# Patient Record
Sex: Female | Born: 1960 | Race: White | Hispanic: No | Marital: Married | Smoking: Never smoker
Health system: Southern US, Community
[De-identification: ages and names within clinical notes are randomized; demographics above are authoritative.]

## PROBLEM LIST (undated history)

## (undated) DIAGNOSIS — I219 Acute myocardial infarction, unspecified: Secondary | ICD-10-CM

## (undated) DIAGNOSIS — I2510 Atherosclerotic heart disease of native coronary artery without angina pectoris: Secondary | ICD-10-CM

## (undated) DIAGNOSIS — E785 Hyperlipidemia, unspecified: Secondary | ICD-10-CM

## (undated) HISTORY — DX: Hyperlipidemia, unspecified: E78.5

## (undated) HISTORY — PX: TONSILLECTOMY AND ADENOIDECTOMY: SUR1326

## (undated) HISTORY — PX: CARDIAC CATHETERIZATION: SHX172

---

## 2004-03-20 ENCOUNTER — Other Ambulatory Visit: Admission: RE | Admit: 2004-03-20 | Discharge: 2004-03-20 | Payer: Self-pay | Admitting: Obstetrics and Gynecology

## 2005-07-09 ENCOUNTER — Ambulatory Visit (HOSPITAL_COMMUNITY): Admission: RE | Admit: 2005-07-09 | Discharge: 2005-07-09 | Payer: Self-pay | Admitting: Obstetrics and Gynecology

## 2005-07-16 ENCOUNTER — Encounter: Admission: RE | Admit: 2005-07-16 | Discharge: 2005-07-16 | Payer: Self-pay | Admitting: Obstetrics and Gynecology

## 2005-09-03 ENCOUNTER — Other Ambulatory Visit: Admission: RE | Admit: 2005-09-03 | Discharge: 2005-09-03 | Payer: Self-pay | Admitting: Obstetrics and Gynecology

## 2006-02-04 ENCOUNTER — Encounter: Admission: RE | Admit: 2006-02-04 | Discharge: 2006-02-04 | Payer: Self-pay | Admitting: Obstetrics and Gynecology

## 2007-01-06 ENCOUNTER — Other Ambulatory Visit: Admission: RE | Admit: 2007-01-06 | Discharge: 2007-01-06 | Payer: Self-pay | Admitting: Obstetrics and Gynecology

## 2007-02-24 ENCOUNTER — Encounter: Admission: RE | Admit: 2007-02-24 | Discharge: 2007-02-24 | Payer: Self-pay | Admitting: Obstetrics and Gynecology

## 2008-02-23 ENCOUNTER — Other Ambulatory Visit: Admission: RE | Admit: 2008-02-23 | Discharge: 2008-02-23 | Payer: Self-pay | Admitting: Obstetrics & Gynecology

## 2008-03-15 ENCOUNTER — Encounter: Admission: RE | Admit: 2008-03-15 | Discharge: 2008-03-15 | Payer: Self-pay | Admitting: Obstetrics and Gynecology

## 2009-03-21 ENCOUNTER — Encounter: Admission: RE | Admit: 2009-03-21 | Discharge: 2009-03-21 | Payer: Self-pay | Admitting: Obstetrics and Gynecology

## 2009-03-28 ENCOUNTER — Other Ambulatory Visit: Admission: RE | Admit: 2009-03-28 | Discharge: 2009-03-28 | Payer: Self-pay | Admitting: Obstetrics and Gynecology

## 2009-11-22 HISTORY — PX: RETINAL DETACHMENT SURGERY: SHX105

## 2010-04-10 ENCOUNTER — Encounter: Admission: RE | Admit: 2010-04-10 | Discharge: 2010-04-10 | Payer: Self-pay | Admitting: Obstetrics and Gynecology

## 2011-03-29 ENCOUNTER — Other Ambulatory Visit: Payer: Self-pay | Admitting: Obstetrics and Gynecology

## 2011-03-29 DIAGNOSIS — Z1231 Encounter for screening mammogram for malignant neoplasm of breast: Secondary | ICD-10-CM

## 2011-04-20 ENCOUNTER — Ambulatory Visit
Admission: RE | Admit: 2011-04-20 | Discharge: 2011-04-20 | Disposition: A | Discharge status: Home / Self Care | Payer: PRIVATE HEALTH INSURANCE | Source: Ambulatory Visit | Attending: Obstetrics and Gynecology | Admitting: Obstetrics and Gynecology

## 2011-04-20 DIAGNOSIS — Z1231 Encounter for screening mammogram for malignant neoplasm of breast: Secondary | ICD-10-CM

## 2012-03-21 ENCOUNTER — Other Ambulatory Visit: Payer: Self-pay | Admitting: Obstetrics and Gynecology

## 2012-03-21 DIAGNOSIS — Z1231 Encounter for screening mammogram for malignant neoplasm of breast: Secondary | ICD-10-CM

## 2012-04-24 ENCOUNTER — Ambulatory Visit
Admission: RE | Admit: 2012-04-24 | Discharge: 2012-04-24 | Disposition: A | Discharge status: Home / Self Care | Payer: PRIVATE HEALTH INSURANCE | Source: Ambulatory Visit | Attending: Obstetrics and Gynecology | Admitting: Obstetrics and Gynecology

## 2012-04-24 DIAGNOSIS — Z1231 Encounter for screening mammogram for malignant neoplasm of breast: Secondary | ICD-10-CM

## 2013-03-20 ENCOUNTER — Other Ambulatory Visit: Payer: Self-pay

## 2013-03-20 DIAGNOSIS — Z1231 Encounter for screening mammogram for malignant neoplasm of breast: Secondary | ICD-10-CM

## 2013-04-25 ENCOUNTER — Ambulatory Visit
Admission: RE | Admit: 2013-04-25 | Discharge: 2013-04-25 | Disposition: A | Discharge status: Home / Self Care | Payer: PRIVATE HEALTH INSURANCE | Source: Ambulatory Visit

## 2013-04-25 DIAGNOSIS — Z1231 Encounter for screening mammogram for malignant neoplasm of breast: Secondary | ICD-10-CM

## 2013-04-29 ENCOUNTER — Other Ambulatory Visit: Payer: Self-pay | Admitting: Certified Nurse Midwife

## 2013-04-30 ENCOUNTER — Telehealth: Payer: Self-pay | Admitting: *Deleted

## 2013-04-30 MED ORDER — DROSPIRENONE-ETHINYL ESTRADIOL 3-0.02 MG PO TABS: 1.0000 | ORAL_TABLET | Freq: Every day | ORAL | Status: DC

## 2013-04-30 NOTE — Telephone Encounter (Signed)
agree

## 2013-04-30 NOTE — Telephone Encounter (Signed)
last aex- 04/28/2012. next aex- 05/15/2013. 1 pack of Yaz/0 refills sent to pharmacy to maintain coverage until next aex.

## 2013-05-09 ENCOUNTER — Encounter: Payer: Self-pay | Admitting: Certified Nurse Midwife

## 2013-05-15 ENCOUNTER — Ambulatory Visit (INDEPENDENT_AMBULATORY_CARE_PROVIDER_SITE_OTHER): Payer: PRIVATE HEALTH INSURANCE | Admitting: Certified Nurse Midwife

## 2013-05-15 ENCOUNTER — Encounter: Payer: Self-pay | Admitting: Certified Nurse Midwife

## 2013-05-15 VITALS — BP 100/60 | HR 80 | Resp 16 | Ht 64.25 in | Wt 134.0 lb

## 2013-05-15 DIAGNOSIS — Z309 Encounter for contraceptive management, unspecified: Secondary | ICD-10-CM

## 2013-05-15 DIAGNOSIS — IMO0001 Reserved for inherently not codable concepts without codable children: Secondary | ICD-10-CM

## 2013-05-15 DIAGNOSIS — Z01419 Encounter for gynecological examination (general) (routine) without abnormal findings: Secondary | ICD-10-CM

## 2013-05-15 DIAGNOSIS — Z0000 Encounter for general adult medical examination without abnormal findings: Secondary | ICD-10-CM

## 2013-05-15 LAB — POCT URINALYSIS DIPSTICK
Bilirubin, UA: NEGATIVE
Blood, UA: NEGATIVE
Glucose, UA: NEGATIVE
Nitrite, UA: NEGATIVE
Protein, UA: NEGATIVE
pH, UA: 5

## 2013-05-15 LAB — HEMOGLOBIN A1C
Hgb A1c MFr Bld: 5.2 % (ref <5.7)
Mean Plasma Glucose: 103 mg/dL (ref <117)

## 2013-05-15 MED ORDER — DROSPIRENONE-ETHINYL ESTRADIOL 3-0.02 MG PO TABS: 1.0000 | ORAL_TABLET | Freq: Every day | ORAL | Status: DC

## 2013-05-15 NOTE — Progress Notes (Signed)
52 y.o. G2P1001 Married Caucasian Fe here for annual exam.   Periods normal, sometimes amount changes, but no problems.  Contraception working well, uses OCP. Saw PCP regarding elevated Triclycerides , changed diet and returned to normal. Released to follow up here. No health issues today. Fasted for labs. Patient's last menstrual period was 05/09/2013.          Sexually active: yes  The current method of family planning is OCP (estrogen/progesterone).    Exercising: yes  walking Smoker:  no  Health Maintenance: Pap:  04-28-12 neg HPV HR neg MMG: 04-25-13 nprmal Colonoscopy:  none BMD:   none TDaP:  02-23-08 Labs: Poct urine neg-, Hgb-13.3 Self breast exam: montly   reports that she has never smoked. She does not have any smokeless tobacco history on file. She reports that she drinks about 1.5 ounces of alcohol per week. She reports that she does not use illicit drugs.  No past medical history on file.  Past Surgical History  Procedure Laterality Date  . Retinal detachment surgery  2011    Current Outpatient Prescriptions  Medication Sig Dispense Refill  . calcium carbonate (TUMS FRESHERS) 500 MG chewable tablet Chew 1 tablet by mouth as needed for heartburn.      . drospirenone-ethinyl estradiol (YAZ) 3-0.02 MG tablet Take 1 tablet by mouth daily.  1 Package  0  . Multiple Vitamins-Minerals (MULTIVITAMIN PO) Take by mouth daily.      Marland Kitchen YAZ 3-0.02 MG tablet TAKE ONE TABLET BY MOUTH ONE TIME DAILY  28 tablet  0   No current facility-administered medications for this visit.    Family History  Problem Relation Age of Onset  . Hypertension Father   . Cancer Maternal Uncle     prostate  . Cancer Maternal Grandfather     prostate  . Heart disease Paternal Grandmother   . Heart disease Paternal Grandfather     ROS:  Pertinent items are noted in HPI.  Otherwise, a comprehensive ROS was negative.  Exam:   Ht 5' 4.25" (1.632 m)  Wt 134 lb (60.782 kg)  BMI 22.82 kg/m2  LMP  05/09/2013 Height: 5' 4.25" (163.2 cm)  Ht Readings from Last 3 Encounters:  05/15/13 5' 4.25" (1.632 m)    General appearance: alert, cooperative and appears stated age Head: Normocephalic, without obvious abnormality, atraumatic Neck: no adenopathy, supple, symmetrical, trachea midline and thyroid normal to inspection and palpation Lungs: clear to auscultation bilaterally Breasts: normal appearance, no masses or tenderness, No nipple retraction or dimpling, No nipple discharge or bleeding, No axillary or supraclavicular adenopathy Heart: regular rate and rhythm Abdomen: soft, non-tender; no masses,  no organomegaly Extremities: extremities normal, atraumatic, no cyanosis or edema Skin: Skin color, texture, turgor normal. No rashes or lesions Lymph nodes: Cervical, supraclavicular, and axillary nodes normal. No abnormal inguinal nodes palpated Neurologic: Grossly normal   Pelvic: External genitalia:  no lesions              Urethra:  normal appearing urethra with no masses, tenderness or lesions              Bartholin's and Skene's: normal                 Vagina: normal appearing vagina with normal color and discharge, no lesions              Cervix: normal and  non tender              Pap taken:  no Bimanual Exam:  Uterus:  normal size, contour, position, consistency, mobility, non-tender and retroverted              Adnexa: normal adnexa and no mass, fullness, tenderness               Rectovaginal: Confirms               Anus:  normal sphincter tone, no lesions  A:  Well Woman with normal exam  Contraception OCP  P:  Reviewed health and wellness pertinent to exam   Rx Yaz see order  Labs; Lipid panel, TSH, Hgb A-1c  Pap smear as per guidelines  Mammogram yearly pap smear not taken today  counseled on breast self exam, mammography screening, adequate intake of calcium and vitamin D, diet and exercise  return annually or prn  An After Visit Summary was printed and given to  the patient.  Reviewed, TL

## 2013-05-15 NOTE — Patient Instructions (Signed)

## 2013-05-16 LAB — LIPID PANEL
HDL: 63 mg/dL (ref >39)
LDL Cholesterol: 53 mg/dL (ref 0–99)
Total CHOL/HDL Ratio: 2.5 Ratio
Triglycerides: 204 mg/dL — ABNORMAL HIGH (ref <150)
VLDL: 41 mg/dL — ABNORMAL HIGH (ref 0–40)

## 2013-05-17 ENCOUNTER — Telehealth: Payer: Self-pay

## 2013-05-17 NOTE — Telephone Encounter (Signed)
Patient notified of results.

## 2013-05-17 NOTE — Telephone Encounter (Signed)
Message copied by Eliezer Bottom on Thu May 17, 2013 10:42 AM ------      Message from: Verner Chol      Created: Wed May 16, 2013 10:04 PM       Notify Triglycerides still elevated 204 and VLDL( bad cholesterol ) just slightly elevated.      Needs to follow up with PCP as discussed      Hgb A1-c is normal and TSH normal ------

## 2013-05-17 NOTE — Telephone Encounter (Signed)
lmtcb

## 2013-09-27 ENCOUNTER — Other Ambulatory Visit: Payer: Self-pay

## 2014-03-25 ENCOUNTER — Other Ambulatory Visit: Payer: Self-pay

## 2014-03-25 DIAGNOSIS — Z1231 Encounter for screening mammogram for malignant neoplasm of breast: Secondary | ICD-10-CM

## 2014-04-26 ENCOUNTER — Ambulatory Visit
Admission: RE | Admit: 2014-04-26 | Discharge: 2014-04-26 | Disposition: A | Discharge status: Home / Self Care | Payer: PRIVATE HEALTH INSURANCE | Source: Ambulatory Visit

## 2014-04-26 DIAGNOSIS — Z1231 Encounter for screening mammogram for malignant neoplasm of breast: Secondary | ICD-10-CM

## 2014-05-06 ENCOUNTER — Other Ambulatory Visit: Payer: Self-pay | Admitting: Certified Nurse Midwife

## 2014-05-06 NOTE — Telephone Encounter (Signed)
AEX scheduled for 05/16/14 #28/0 refills sent in to pharmacy to last patient until AEX

## 2014-05-16 ENCOUNTER — Encounter: Payer: Self-pay | Admitting: Certified Nurse Midwife

## 2014-05-16 ENCOUNTER — Ambulatory Visit (INDEPENDENT_AMBULATORY_CARE_PROVIDER_SITE_OTHER): Payer: PRIVATE HEALTH INSURANCE | Admitting: Certified Nurse Midwife

## 2014-05-16 VITALS — BP 118/64 | HR 68 | Resp 16 | Ht 63.75 in | Wt 136.0 lb

## 2014-05-16 DIAGNOSIS — Z309 Encounter for contraceptive management, unspecified: Secondary | ICD-10-CM

## 2014-05-16 DIAGNOSIS — Z01419 Encounter for gynecological examination (general) (routine) without abnormal findings: Secondary | ICD-10-CM

## 2014-05-16 DIAGNOSIS — Z0000 Encounter for general adult medical examination without abnormal findings: Secondary | ICD-10-CM

## 2014-05-16 DIAGNOSIS — Z124 Encounter for screening for malignant neoplasm of cervix: Secondary | ICD-10-CM

## 2014-05-16 LAB — POCT URINALYSIS DIPSTICK
Bilirubin, UA: NEGATIVE
GLUCOSE UA: NEGATIVE
KETONES UA: NEGATIVE
Leukocytes, UA: NEGATIVE
Nitrite, UA: NEGATIVE
PH UA: 5
Protein, UA: NEGATIVE
RBC UA: NEGATIVE
UROBILINOGEN UA: NEGATIVE

## 2014-05-16 MED ORDER — DROSPIRENONE-ETHINYL ESTRADIOL 3-0.02 MG PO TABS: ORAL_TABLET | ORAL | Status: DC

## 2014-05-16 NOTE — Progress Notes (Signed)
53 y.o. 691P1001 Married Caucasian Fe here for annual exam.  Periods normal, but lighter. No cramping or issues. Up to date with mammogram. Recent visit with PCP 05/01/14, all labs normal. No problems. Contraception working well. Desires OCP again. No other health issues. Going to [place]!  Patient's last menstrual period was 05/08/2014.          Sexually active: Yes.    The current method of family planning is OCP (estrogen/progesterone).    Exercising: Yes.    walking & light hand weights Smoker:  no  Health Maintenance: Pap: 04-28-12 neg HPV HR neg MMG:  04-26-14 normal Density category D Bi rads category 1 negative Colonoscopy:  none BMD:   none TDaP:  2009 Labs: Poct urine-neg Self breast exam: done occ   reports that she has never smoked. She does not have any smokeless tobacco history on file. She reports that she drinks about 2 ounces of alcohol per week. She reports that she does not use illicit drugs.  Past Medical History  Diagnosis Date  . Hyperlipidemia     Past Surgical History  Procedure Laterality Date  . Retinal detachment surgery  2011  . Tonsillectomy and adenoidectomy      Current Outpatient Prescriptions  Medication Sig Dispense Refill  . calcium carbonate (TUMS FRESHERS) 500 MG chewable tablet Chew 1 tablet by mouth as needed for heartburn.      Marland Kitchen. GIANVI 3-0.02 MG tablet TAKE ONE TABLET BY MOUTH ONE TIME DAILY   28 tablet  2  . Multiple Vitamins-Minerals (MULTIVITAMIN PO) Take by mouth daily.      . Omega-3 Fatty Acids (FISH OIL) 1000 MG CAPS Take 2,000 mg by mouth daily.       Marland Kitchen. LORazepam (ATIVAN) 0.5 MG tablet as needed.       No current facility-administered medications for this visit.    Family History  Problem Relation Age of Onset  . Hypertension Father   . Cancer Maternal Uncle     prostate  . Cancer Maternal Grandfather     prostate  . Heart disease Paternal Grandmother   . Heart disease Paternal Grandfather     ROS:   Pertinent items are noted in HPI.  Otherwise, a comprehensive ROS was negative.  Exam:   BP 118/64  Pulse 68  Resp 16  Ht 5' 3.75" (1.619 m)  Wt 136 lb (61.689 kg)  BMI 23.53 kg/m2  LMP 05/08/2014 Height: 5' 3.75" (161.9 cm)  Ht Readings from Last 3 Encounters:  05/16/14 5' 3.75" (1.619 m)  05/15/13 5' 4.25" (1.632 m)    General appearance: alert, cooperative and appears stated age Head: Normocephalic, without obvious abnormality, atraumatic Neck: no adenopathy, supple, symmetrical, trachea midline and thyroid normal to inspection and palpation and non-palpable Lungs: clear to auscultation bilaterally Breasts: normal appearance, no masses or tenderness, No nipple retraction or dimpling, No nipple discharge or bleeding, No axillary or supraclavicular adenopathy Heart: regular rate and rhythm Abdomen: soft, non-tender; no masses,  no organomegaly Extremities: extremities normal, atraumatic, no cyanosis or edema Skin: Skin color, texture, turgor normal. No rashes or lesions Lymph nodes: Cervical, supraclavicular, and axillary nodes normal. No abnormal inguinal nodes palpated Neurologic: Grossly normal   Pelvic: External genitalia:  no lesions              Urethra:  normal appearing urethra with no masses, tenderness or lesions              Bartholin's and Skene's: normal  Vagina: normal appearing vagina with normal color and discharge, no lesions              Cervix: normal, non tender              Pap taken: Yes.   Bimanual Exam:  Uterus:  normal size, contour, position, consistency, mobility, non-tender and anteverted              Adnexa: normal adnexa and no mass, fullness, tenderness               Rectovaginal: Confirms               Anus:  normal sphincter tone, no lesions  A:  Well Woman with normal exam  Contraception OCP desired  Colonoscopy due  P:   Reviewed health and wellness pertinent to exam  Rx Yaz see order, discussed lowering dosage at next  aex. If periods stop advise and if symptomatic of menopause  Pap smear taken today with HPV reflex  Colonoscopy risks and benefits discussed. Patient plans to schedule this year. Will call for referral when ready.   counseled on breast self exam, mammography screening, use and side effects of OCP's, adequate intake of calcium and vitamin D, diet and exercise  return annually or prn  An After Visit Summary was printed and given to the patient.

## 2014-05-16 NOTE — Patient Instructions (Signed)

## 2014-05-20 LAB — IPS PAP TEST WITH REFLEX TO HPV

## 2014-05-20 NOTE — Progress Notes (Signed)
Reviewed personally.  M. Suzanne Miller, MD.  

## 2014-09-23 ENCOUNTER — Encounter: Payer: Self-pay | Admitting: Certified Nurse Midwife

## 2015-03-21 ENCOUNTER — Other Ambulatory Visit: Payer: Self-pay

## 2015-03-21 DIAGNOSIS — Z1231 Encounter for screening mammogram for malignant neoplasm of breast: Secondary | ICD-10-CM

## 2015-04-30 ENCOUNTER — Ambulatory Visit
Admission: RE | Admit: 2015-04-30 | Discharge: 2015-04-30 | Disposition: A | Discharge status: Home / Self Care | Payer: No Typology Code available for payment source | Source: Ambulatory Visit

## 2015-04-30 DIAGNOSIS — Z1231 Encounter for screening mammogram for malignant neoplasm of breast: Secondary | ICD-10-CM

## 2015-05-27 ENCOUNTER — Other Ambulatory Visit: Payer: Self-pay | Admitting: Certified Nurse Midwife

## 2015-05-27 NOTE — Telephone Encounter (Signed)
Medication refill request: Loryna  Last AEX: 05/16/14 with DL  Next AEX: 1/61/097/22/16 with DL  Last MMG (if hormonal medication request): 04/30/15 bi-rads 1: negative Refill authorized: #28/0 rfs

## 2015-06-13 ENCOUNTER — Ambulatory Visit (INDEPENDENT_AMBULATORY_CARE_PROVIDER_SITE_OTHER): Payer: PRIVATE HEALTH INSURANCE | Admitting: Certified Nurse Midwife

## 2015-06-13 ENCOUNTER — Encounter: Payer: Self-pay | Admitting: Certified Nurse Midwife

## 2015-06-13 VITALS — BP 110/70 | HR 74 | Resp 16 | Ht 64.0 in | Wt 136.0 lb

## 2015-06-13 DIAGNOSIS — Z3041 Encounter for surveillance of contraceptive pills: Secondary | ICD-10-CM

## 2015-06-13 DIAGNOSIS — Z0000 Encounter for general adult medical examination without abnormal findings: Secondary | ICD-10-CM | POA: Diagnosis not present

## 2015-06-13 DIAGNOSIS — N951 Menopausal and female climacteric states: Secondary | ICD-10-CM | POA: Diagnosis not present

## 2015-06-13 DIAGNOSIS — Z01419 Encounter for gynecological examination (general) (routine) without abnormal findings: Secondary | ICD-10-CM | POA: Diagnosis not present

## 2015-06-13 DIAGNOSIS — Z1211 Encounter for screening for malignant neoplasm of colon: Secondary | ICD-10-CM

## 2015-06-13 LAB — POCT URINALYSIS DIPSTICK
Bilirubin, UA: NEGATIVE
Blood, UA: NEGATIVE
Glucose, UA: NEGATIVE
KETONES UA: NEGATIVE
LEUKOCYTES UA: NEGATIVE
NITRITE UA: NEGATIVE
PH UA: 5
Protein, UA: NEGATIVE
Urobilinogen, UA: NEGATIVE

## 2015-06-13 LAB — CBC
HEMATOCRIT: 43.4 % (ref 36.0–46.0)
HEMOGLOBIN: 14.4 g/dL (ref 12.0–15.0)
MCH: 29.5 pg (ref 26.0–34.0)
MCHC: 33.2 g/dL (ref 30.0–36.0)
MCV: 88.9 fL (ref 78.0–100.0)
MPV: 8.9 fL (ref 8.6–12.4)
Platelets: 315 10*3/uL (ref 150–400)
RBC: 4.88 MIL/uL (ref 3.87–5.11)
RDW: 12.7 % (ref 11.5–15.5)
WBC: 9.4 10*3/uL (ref 4.0–10.5)

## 2015-06-13 MED ORDER — DROSPIRENONE-ETHINYL ESTRADIOL 3-0.02 MG PO TABS
1.0000 | ORAL_TABLET | Freq: Every day | ORAL | Status: DC
Start: 2015-06-13 — End: 2016-06-16

## 2015-06-13 NOTE — Progress Notes (Signed)
54 y.o. G47P1001 Married  Caucasian Fe here for annual exam. Periods very light and or just spotting. Contraception OCP,working well. Sees PCP prn.  LMP:  06/06/2015         Sexually active: Yes.    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     Exercising: Yes.    walking Smoker:  no  Health Maintenance: Pap:  04/28/14 MMG:  04/26/14 normal density Category D Bi-rads 1 neg. Colonoscopy:  none BMD:   none TDaP:  2009 Labs: drawn, poct urine-neg, Hgb-14.0   reports that she has never smoked. She does not have any smokeless tobacco history on file. She reports that she drinks about 2.0 oz of alcohol per week. She reports that she does not use illicit drugs.  Past Medical History  Diagnosis Date  . Hyperlipidemia     Past Surgical History  Procedure Laterality Date  . Retinal detachment surgery  2011  . Tonsillectomy and adenoidectomy      Current Outpatient Prescriptions  Medication Sig Dispense Refill  . calcium carbonate (TUMS FRESHERS) 500 MG chewable tablet Chew 1 tablet by mouth as needed for heartburn.    Marland Kitchen LORazepam (ATIVAN) 0.5 MG tablet as needed.    Marland Kitchen LORYNA 3-0.02 MG tablet TAKE ONE TABLET BY MOUTH ONE TIME DAILY 28 tablet 0  . Multiple Vitamins-Minerals (MULTIVITAMIN PO) Take by mouth daily.    . Omega-3 Fatty Acids (FISH OIL) 1000 MG CAPS Take 2,000 mg by mouth daily.      No current facility-administered medications for this visit.    Family History  Problem Relation Age of Onset  . Hypertension Father   . Cancer Maternal Uncle     prostate  . Cancer Maternal Grandfather     prostate  . Heart disease Paternal Grandmother   . Heart disease Paternal Grandfather     ROS:  Pertinent items are noted in HPI.  Otherwise, a comprehensive ROS was negative.  Exam:   Ht  (1.626 m)  Wt 136 lb (61.689 kg)  BMI 23.33 kg/m2  LMP 06/06/2015 (Exact Date) Height:  (162.6 cm) Ht Readings from Last 3 Encounters:  06/13/15  (1.626 m)   05/16/14 5' 3.75" (1.619 m)  05/15/13 5' 4.25" (1.632 m)    General appearance: alert, cooperative and appears stated age Head: Normocephalic, without obvious abnormality, atraumatic Neck: no adenopathy, supple, symmetrical, trachea midline and thyroid normal to inspection and palpation Lungs: clear to auscultation bilaterally Breasts: normal appearance, no masses or tenderness, No nipple retraction or dimpling, No nipple discharge or bleeding, No axillary or supraclavicular adenopathy Heart: regular rate and rhythm Abdomen: soft, non-tender; no masses,  no organomegaly Extremities: extremities normal, atraumatic, no cyanosis or edema Skin: Skin color, texture, turgor normal. No rashes or lesions Lymph nodes: Cervical, supraclavicular, and axillary nodes normal. No abnormal inguinal nodes palpated Neurologic: Grossly normal   Pelvic: External genitalia:  no lesions              Urethra:  normal appearing urethra with no masses, tenderness or lesions              Bartholin's and Skene's: normal                 Vagina: normal appearing vagina with normal color and discharge, no lesions              Cervix: normal,non tender, no lesions  Pap taken: No. Bimanual Exam:  Uterus:  normal size, contour, position, consistency, mobility, non-tender and anteverted              Adnexa: normal adnexa and no mass, fullness, tenderness               Rectovaginal: Confirms               Anus:  normal sphincter tone, no lesions  Chaperone present: Yes    A:  Well Woman with normal exam  Contraception OCP desires continuance  Perimenopausal with cycle change  Colonoscopy due   Screening labs  P:   Reviewed health and wellness pertinent to exam  Discussed needs to come off of OCP by 52, patient aware and feel like she is becoming menopausal with cycle changes of lighter and minimal bleeding. Plan to come in 12/12 for [facility], for assessment and will be off OCP for at least a week  prior to drawing. Will not restart at that point. Will refill until then.  Rx Loryna see order  Request referral for colonoscopy will refer to Dr Loreta Ave and will be called with appointment  Labs: Lipid panel, CBC, TSH, Vitamin D, CMP  Pap smear not taken   counseled on breast self exam, mammography screening, adequate intake of calcium and vitamin D, diet and exercise, colonoscopy  return annually or prn  An After Visit Summary was printed and given to the patient.

## 2015-06-13 NOTE — Patient Instructions (Signed)

## 2015-06-13 NOTE — Progress Notes (Signed)
Reviewed personally.  M. Suzanne Anakaren Campion, MD.  

## 2015-06-14 LAB — COMPREHENSIVE METABOLIC PANEL
ALBUMIN: 4.4 g/dL (ref 3.5–5.2)
ALK PHOS: 92 U/L (ref 39–117)
ALT: 21 U/L (ref 0–35)
AST: 23 U/L (ref 0–37)
BUN: 11 mg/dL (ref 6–23)
CALCIUM: 9.8 mg/dL (ref 8.4–10.5)
CO2: 22 mEq/L (ref 19–32)
CREATININE: 0.77 mg/dL (ref 0.50–1.10)
Chloride: 98 mEq/L (ref 96–112)
GLUCOSE: 87 mg/dL (ref 70–99)
POTASSIUM: 4.7 meq/L (ref 3.5–5.3)
SODIUM: 135 meq/L (ref 135–145)
Total Bilirubin: 0.6 mg/dL (ref 0.2–1.2)
Total Protein: 7.1 g/dL (ref 6.0–8.3)

## 2015-06-14 LAB — LIPID PANEL
CHOL/HDL RATIO: 3.4 ratio
Cholesterol: 205 mg/dL — ABNORMAL HIGH (ref 0–200)
HDL: 61 mg/dL (ref >=46)
LDL CALC: 71 mg/dL (ref 0–99)
TRIGLYCERIDES: 364 mg/dL — AB (ref <150)
VLDL: 73 mg/dL — AB (ref 0–40)

## 2015-06-14 LAB — VITAMIN D 25 HYDROXY (VIT D DEFICIENCY, FRACTURES): VIT D 25 HYDROXY: 39 ng/mL (ref 30–100)

## 2015-06-14 LAB — TSH: TSH: 1.772 u[IU]/mL (ref 0.350–4.500)

## 2015-07-23 ENCOUNTER — Telehealth: Payer: Self-pay | Admitting: Certified Nurse Midwife

## 2015-07-23 NOTE — Telephone Encounter (Signed)
Patient returning call. Gave patient referring information.

## 2015-07-23 NOTE — Telephone Encounter (Signed)
Left voicemail regarding referral appointment. The information is listed below. Should the patient need to cancel or reschedule this appointment, please advise them to call the office they've been referred to in order to reschedule.  [facility] [address], [place], [place] [address] [phone]  August 05, 2015 10:45 -- arrive at [employer] card and photo id

## 2015-11-20 ENCOUNTER — Telehealth: Payer: Self-pay

## 2015-11-20 ENCOUNTER — Other Ambulatory Visit (INDEPENDENT_AMBULATORY_CARE_PROVIDER_SITE_OTHER): Payer: Managed Care, Other (non HMO)

## 2015-11-20 DIAGNOSIS — N951 Menopausal and female climacteric states: Secondary | ICD-10-CM

## 2015-11-20 LAB — FOLLICLE STIMULATING HORMONE: FSH: 0.4 m[IU]/mL

## 2015-11-20 NOTE — Telephone Encounter (Signed)
Left message to call back  

## 2015-11-20 NOTE — Telephone Encounter (Signed)
Pt came in for lab visit today & asked phlebotomist when her labwork will be back. Pt stated to her that she guess she will start her pills back. Per office visit it looks like you said you did not want patient to start back on pills at all. Please advise

## 2015-11-20 NOTE — Telephone Encounter (Signed)
We were going to determine if she needed them and then decide after labs done.

## 2015-11-20 NOTE — Telephone Encounter (Signed)
Patient notified as written by provider °

## 2015-11-24 LAB — ANTI MULLERIAN HORMONE: AMH AssessR: <0.03 ng/mL

## 2016-04-16 ENCOUNTER — Other Ambulatory Visit: Payer: Self-pay

## 2016-04-16 DIAGNOSIS — Z1231 Encounter for screening mammogram for malignant neoplasm of breast: Secondary | ICD-10-CM

## 2016-05-24 ENCOUNTER — Ambulatory Visit: Payer: No Typology Code available for payment source

## 2016-05-27 ENCOUNTER — Ambulatory Visit
Admission: RE | Admit: 2016-05-27 | Discharge: 2016-05-27 | Disposition: A | Discharge status: Home / Self Care | Payer: Managed Care, Other (non HMO) | Source: Ambulatory Visit

## 2016-05-27 DIAGNOSIS — Z1231 Encounter for screening mammogram for malignant neoplasm of breast: Secondary | ICD-10-CM

## 2016-06-16 ENCOUNTER — Ambulatory Visit (INDEPENDENT_AMBULATORY_CARE_PROVIDER_SITE_OTHER): Payer: Managed Care, Other (non HMO) | Admitting: Certified Nurse Midwife

## 2016-06-16 ENCOUNTER — Encounter: Payer: Self-pay | Admitting: Certified Nurse Midwife

## 2016-06-16 VITALS — BP 118/72 | HR 72 | Resp 16 | Ht 63.75 in | Wt 145.0 lb

## 2016-06-16 DIAGNOSIS — N912 Amenorrhea, unspecified: Secondary | ICD-10-CM | POA: Diagnosis not present

## 2016-06-16 DIAGNOSIS — Z124 Encounter for screening for malignant neoplasm of cervix: Secondary | ICD-10-CM | POA: Diagnosis not present

## 2016-06-16 DIAGNOSIS — Z0000 Encounter for general adult medical examination without abnormal findings: Secondary | ICD-10-CM | POA: Diagnosis not present

## 2016-06-16 DIAGNOSIS — N951 Menopausal and female climacteric states: Secondary | ICD-10-CM

## 2016-06-16 DIAGNOSIS — Z01419 Encounter for gynecological examination (general) (routine) without abnormal findings: Secondary | ICD-10-CM | POA: Diagnosis not present

## 2016-06-16 LAB — POCT URINALYSIS DIPSTICK
Bilirubin, UA: NEGATIVE
GLUCOSE UA: NEGATIVE
Ketones, UA: NEGATIVE
Leukocytes, UA: NEGATIVE
NITRITE UA: NEGATIVE
PROTEIN UA: NEGATIVE
RBC UA: NEGATIVE
UROBILINOGEN UA: NEGATIVE
pH, UA: 5

## 2016-06-16 LAB — CBC
HCT: 45 % (ref 35.0–45.0)
Hemoglobin: 14.8 g/dL (ref 11.7–15.5)
MCH: 29.5 pg (ref 27.0–33.0)
MCHC: 32.9 g/dL (ref 32.0–36.0)
MCV: 89.6 fL (ref 80.0–100.0)
MPV: 9.5 fL (ref 7.5–12.5)
PLATELETS: 293 10*3/uL (ref 140–400)
RBC: 5.02 MIL/uL (ref 3.80–5.10)
RDW: 13 % (ref 11.0–15.0)
WBC: 8.4 10*3/uL (ref 3.8–10.8)

## 2016-06-16 LAB — POCT URINE PREGNANCY: Preg Test, Ur: NEGATIVE

## 2016-06-16 NOTE — Patient Instructions (Signed)
EXERCISE AND DIET:  We recommended that you start or continue a regular exercise program for good health. Regular exercise means any activity that makes your heart beat faster and makes you sweat.  We recommend exercising at least 30 minutes per day at least 3 days a week, preferably 4 or 5.  We also recommend a diet low in fat and sugar.  Inactivity, poor dietary choices and obesity can cause diabetes, heart attack, stroke, and kidney damage, among others.    ALCOHOL AND SMOKING:  Women should limit their alcohol intake to no more than 7 drinks/beers/glasses of wine (combined, not each!) per week. Moderation of alcohol intake to this level decreases your risk of breast cancer and liver damage. And of course, no recreational drugs are part of a healthy lifestyle.  And absolutely no smoking or even second hand smoke. Most people know smoking can cause heart and lung diseases, but did you know it also contributes to weakening of your bones? Aging of your skin?  Yellowing of your teeth and nails?  CALCIUM AND VITAMIN D:  Adequate intake of calcium and Vitamin D are recommended.  The recommendations for exact amounts of these supplements seem to change often, but generally speaking 600 mg of calcium (either carbonate or citrate) and 800 units of Vitamin D per day seems prudent. Certain women may benefit from higher intake of Vitamin D.  If you are among these women, your doctor will have told you during your visit.    PAP SMEARS:  Pap smears, to check for cervical cancer or precancers,  have traditionally been done yearly, although recent scientific advances have shown that most women can have pap smears less often.  However, every woman still should have a physical exam from her gynecologist every year. It will include a breast check, inspection of the vulva and vagina to check for abnormal growths or skin changes, a visual exam of the cervix, and then an exam to evaluate the size and shape of the uterus and  ovaries.  And after 53 years of age, a rectal exam is indicated to check for rectal cancers. We will also provide age appropriate advice regarding health maintenance, like when you should have certain vaccines, screening for sexually transmitted diseases, bone density testing, colonoscopy, mammograms, etc.   MAMMOGRAMS:  All women over 40 years old should have a yearly mammogram. Many facilities now offer a "3D" mammogram, which may cost around $50 extra out of pocket. If possible,  we recommend you accept the option to have the 3D mammogram performed.  It both reduces the number of women who will be called back for extra views which then turn out to be normal, and it is better than the routine mammogram at detecting truly abnormal areas.    COLONOSCOPY:  Colonoscopy to screen for colon cancer is recommended for all women at age 50.  We know, you hate the idea of the prep.  We agree, BUT, having colon cancer and not knowing it is worse!!  Colon cancer so often starts as a polyp that can be seen and removed at colonscopy, which can quite literally save your life!  And if your first colonoscopy is normal and you have no family history of colon cancer, most women don't have to have it again for 10 years.  Once every ten years, you can do something that may end up saving your life, right?  We will be happy to help you get it scheduled when you are ready.    Be sure to check your insurance coverage so you understand how much it will cost.  It may be covered as a preventative service at no cost, but you should check your particular policy.     Perimenopause Perimenopause is the time when your body begins to move into the menopause (no menstrual period for 12 straight months). It is a natural process. Perimenopause can begin 2-8 years before the menopause and usually lasts for 1 year after the menopause. During this time, your ovaries may or may not produce an egg. The ovaries vary in their production of estrogen and  progesterone hormones each month. This can cause irregular menstrual periods, difficulty getting pregnant, vaginal bleeding between periods, and uncomfortable symptoms. CAUSES  Irregular production of the ovarian hormones, estrogen and progesterone, and not ovulating every month.  Other causes include:  Tumor of the pituitary gland in the brain.  Medical disease that affects the ovaries.  Radiation treatment.  Chemotherapy.  Unknown causes.  Heavy smoking and excessive alcohol intake can bring on perimenopause sooner. SIGNS AND SYMPTOMS   Hot flashes.  Night sweats.  Irregular menstrual periods.  Decreased sex drive.  Vaginal dryness.  Headaches.  Mood swings.  Depression.  Memory problems.  Irritability.  Tiredness.  Weight gain.  Trouble getting pregnant.  The beginning of losing bone cells (osteoporosis).  The beginning of hardening of the arteries (atherosclerosis). DIAGNOSIS  Your health care provider will make a diagnosis by analyzing your age, menstrual history, and symptoms. He or she will do a physical exam and note any changes in your body, especially your female organs. Female hormone tests may or may not be helpful depending on the amount of female hormones you produce and when you produce them. However, other hormone tests may be helpful to rule out other problems. TREATMENT  In some cases, no treatment is needed. The decision on whether treatment is necessary during the perimenopause should be made by you and your health care provider based on how the symptoms are affecting you and your lifestyle. Various treatments are available, such as:  Treating individual symptoms with a specific medicine for that symptom.  Herbal medicines that can help specific symptoms.  Counseling.  Group therapy. HOME CARE INSTRUCTIONS   Keep track of your menstrual periods (when they occur, how heavy they are, how long between periods, and how long they last) as  well as your symptoms and when they started.  Only take over-the-counter or prescription medicines as directed by your health care provider.  Sleep and rest.  Exercise.  Eat a diet that contains calcium (good for your bones) and soy (acts like the estrogen hormone).  Do not smoke.  Avoid alcoholic beverages.  Take vitamin supplements as recommended by your health care provider. Taking vitamin E may help in certain cases.  Take calcium and vitamin D supplements to help prevent bone loss.  Group therapy is sometimes helpful.  Acupuncture may help in some cases. SEEK MEDICAL CARE IF:   You have questions about any symptoms you are having.  You need a referral to a specialist (gynecologist, psychiatrist, or psychologist). SEEK IMMEDIATE MEDICAL CARE IF:   You have vaginal bleeding.  Your period lasts longer than 8 days.  Your periods are recurring sooner than 21 days.  You have bleeding after intercourse.  You have severe depression.  You have pain when you urinate.  You have severe headaches.  You have vision problems.   This information is not intended to replace advice   given to you by your health care provider. Make sure you discuss any questions you have with your health care provider.   Document Released: 12/16/2004 Document Revised: 11/29/2014 Document Reviewed: 06/07/2013 Elsevier Interactive Patient Education 2016 [employer]  

## 2016-06-16 NOTE — Progress Notes (Signed)
55 y.o. G66P1001 Married  Caucasian Fe here for annual exam. Stopped OCP after last visit. LMP 11/18/15. Denies vaginal bleeding since then. Denies vaginal dryness, has always uses lubrication. Some hot flashes and night sweats, but have improved. Sees Dr.Sun PCP. Desires screening labs today. 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 No other health issues today.  Patient's last menstrual period was 11/18/2015 (exact date).        Sexually active: Yes.    The current method of family planning is none.    Exercising: Yes.    walking Smoker:  no  Health Maintenance: Pap:  04-28-14 neg MMG:  05-27-16 category c density birads 1:neg Colonoscopy:  2016 f/u 52yrs BMD:   none TDaP:  2009 Shingles: no Pneumonia: no Hep C and HIV: Hep C. Labs: poct urine-neg, poct upt-neg Self breast exam: done monthly   reports that she has never smoked. She does not have any smokeless tobacco history on file. She reports that she drinks about 3.0 - 4.2 oz of alcohol per week . She reports that she does not use drugs.  Past Medical History:  Diagnosis Date  . Hyperlipidemia     Past Surgical History:  Procedure Laterality Date  . RETINAL DETACHMENT SURGERY  2011  . TONSILLECTOMY AND ADENOIDECTOMY      Current Outpatient Prescriptions  Medication Sig Dispense Refill  . calcium carbonate (TUMS FRESHERS) 500 MG chewable tablet Chew 1 tablet by mouth as needed for heartburn.    . Multiple Vitamins-Minerals (MULTIVITAMIN PO) Take by mouth daily.    . Omega-3 Fatty Acids (FISH OIL) 1000 MG CAPS Take 2,000 mg by mouth daily.     Marland Kitchen LORazepam (ATIVAN) 0.5 MG tablet as needed.     No current facility-administered medications for this visit.     Family History  Problem Relation Age of Onset  . Hypertension Father   . Cancer Maternal Uncle     prostate  . Cancer Maternal Grandfather     prostate  . Heart disease Paternal Grandmother   . Heart disease Paternal Grandfather     ROS:   Pertinent items are noted in HPI.  Otherwise, a comprehensive ROS was negative.  Exam:   BP 118/72   Pulse 72   Resp 16   Ht 5' 3.75" (1.619 m)   Wt 145 lb (65.8 kg)   LMP 11/18/2015 (Exact Date)   BMI 25.08 kg/m  Height: 5' 3.75" (161.9 cm) Ht Readings from Last 3 Encounters:  06/16/16 5' 3.75" (1.619 m)  06/13/15 5\' 4"  (1.626 m)  05/16/14 5' 3.75" (1.619 m)    General appearance: alert, cooperative and appears stated age Head: Normocephalic, without obvious abnormality, atraumatic Neck: no adenopathy, supple, symmetrical, trachea midline and thyroid normal to inspection and palpation Lungs: clear to auscultation bilaterally Breasts: normal appearance, no masses or tenderness, No nipple retraction or dimpling, No nipple discharge or bleeding, No axillary or supraclavicular adenopathy Heart: regular rate and rhythm Abdomen: soft, non-tender; no masses,  no organomegaly Extremities: extremities normal, atraumatic, no cyanosis or edema Skin: Skin color, texture, turgor normal. No rashes or lesions Lymph nodes: Cervical, supraclavicular, and axillary nodes normal. No abnormal inguinal nodes palpated Neurologic: Grossly normal   Pelvic: External genitalia:  no lesions              Urethra:  normal appearing urethra with no masses, tenderness or lesions              Bartholin's and Skene's:  normal                 Vagina: normal appearing vagina with normal color and discharge, no lesions, mild cystocele noted              Cervix: no bleeding following Pap, no cervical motion tenderness, no lesions and normal appearance              Pap taken: Yes.   Bimanual Exam:  Uterus:  normal size, contour, position, consistency, mobility, non-tender              Adnexa: normal adnexa and no mass, fullness, tenderness               Rectovaginal: Confirms               Anus:  normal sphincter tone, no lesions  Chaperone present: yes  A:  Well Woman with normal exam  Contraception none AMH  2016 <0.03  Perimenopausal with amenorrhea  Cystocele grade 1  Screening labs  P:   Reviewed health and wellness pertinent to exam  Discussed perimenopause/menopause and expectations. Discussed will need to do labs for evaluation for amenorrhea and may need Provera challenge (explanation given). Questions addressed.  Lab: FSH , TSH, Prolactin  Discussed cystocele finding and etiology. Patient shown area in mirror and instructed on kegel exercises. Questions answered. Will advise if any urinary incontinence problems.  Labs:Vitamin D, Hep C, CBC, Lipid panel  Pap smear as above with HPVHR   counseled on breast self exam, mammography screening, menopause, adequate intake of calcium and vitamin D, diet and exercise, Kegel's exercises  return annually or prn  An After Visit Summary was printed and given to the patient.

## 2016-06-17 LAB — FOLLICLE STIMULATING HORMONE: FSH: 63.8 m[IU]/mL

## 2016-06-17 LAB — LIPID PANEL
CHOL/HDL RATIO: 2.9 ratio (ref <=5.0)
CHOLESTEROL: 210 mg/dL — AB (ref 125–200)
HDL: 73 mg/dL (ref >=46)
LDL Cholesterol: 110 mg/dL (ref <130)
Triglycerides: 133 mg/dL (ref <150)
VLDL: 27 mg/dL (ref <30)

## 2016-06-17 LAB — TSH: TSH: 1.5 mIU/L

## 2016-06-17 LAB — PROLACTIN: PROLACTIN: 3.9 ng/mL

## 2016-06-17 LAB — HEPATITIS C ANTIBODY: HCV Ab: NEGATIVE

## 2016-06-17 LAB — VITAMIN D 25 HYDROXY (VIT D DEFICIENCY, FRACTURES): VIT D 25 HYDROXY: 40 ng/mL (ref 30–100)

## 2016-06-17 NOTE — Progress Notes (Signed)
Encounter reviewed Jill Jertson, MD   

## 2016-06-18 LAB — IPS PAP TEST WITH HPV

## 2017-02-03 DIAGNOSIS — H35413 Lattice degeneration of retina, bilateral: Secondary | ICD-10-CM | POA: Diagnosis not present

## 2017-02-03 DIAGNOSIS — H5213 Myopia, bilateral: Secondary | ICD-10-CM | POA: Diagnosis not present

## 2017-05-02 ENCOUNTER — Other Ambulatory Visit: Payer: Self-pay | Admitting: Certified Nurse Midwife

## 2017-05-02 DIAGNOSIS — Z1231 Encounter for screening mammogram for malignant neoplasm of breast: Secondary | ICD-10-CM

## 2017-05-30 ENCOUNTER — Ambulatory Visit
Admission: RE | Admit: 2017-05-30 | Discharge: 2017-05-30 | Disposition: A | Discharge status: Home / Self Care | Payer: BLUE CROSS/BLUE SHIELD | Source: Ambulatory Visit | Attending: Certified Nurse Midwife | Admitting: Certified Nurse Midwife

## 2017-05-30 DIAGNOSIS — Z1231 Encounter for screening mammogram for malignant neoplasm of breast: Secondary | ICD-10-CM | POA: Diagnosis not present

## 2017-05-30 DIAGNOSIS — H5210 Myopia, unspecified eye: Secondary | ICD-10-CM | POA: Diagnosis not present

## 2017-06-16 NOTE — Progress Notes (Signed)
56 y.o. 641P1001 Married  Caucasian Fe here for annual exam. Menopausal no vaginal bleeding. Still battling with vaginal dryness, using OTC lubricant only. Planning visit with PCP with labs, before school year starts.  No other health issues today. Daughter is junior in college,did internship in theater this summer!  Patient's last menstrual period was 11/18/2015 (exact date).          Sexually active: Yes.    The current method of family planning is post menopausal status.    Exercising: Yes.    walking Smoker:  no  Health Maintenance: Pap: 06/16/16, Negative  04/28/14, Negative History of Abnormal Pap: no MMG: 05/30/17, 3D-yes, Density Category C, Bi-Rads 1:  Negative Self Breast exams: yes Colonoscopy: 09/12/15, Normal, repeat in 10 years BMD: None TDaP: 2009 Hep C: 06/16/16 Labs: PCP takes care of screening labs    reports that she has never smoked. She has never used smokeless tobacco. She reports that she drinks about 3.0 - 4.2 oz of alcohol per week . She reports that she does not use drugs.  Past Medical History:  Diagnosis Date  . Hyperlipidemia     Past Surgical History:  Procedure Laterality Date  . RETINAL DETACHMENT SURGERY  2011  . TONSILLECTOMY AND ADENOIDECTOMY      Current Outpatient Prescriptions  Medication Sig Dispense Refill  . calcium carbonate (TUMS FRESHERS) 500 MG chewable tablet Chew 1 tablet by mouth as needed for heartburn.    Marland Kitchen. LORazepam (ATIVAN) 0.5 MG tablet as needed.    . Multiple Vitamins-Minerals (MULTIVITAMIN PO) Take by mouth daily.    . Omega-3 Fatty Acids (FISH OIL) 1000 MG CAPS Take 2,000 mg by mouth daily.      No current facility-administered medications for this visit.     Family History  Problem Relation Age of Onset  . Heart disease Paternal Grandmother   . Heart disease Paternal Grandfather   . Hypertension Father   . Cancer Maternal Uncle        prostate  . Cancer Maternal Grandfather        prostate    ROS:  Pertinent  items are noted in HPI.  Otherwise, a comprehensive ROS was negative.  Exam:   BP 124/76 (BP Location: Right Arm, Patient Position: Sitting, Cuff Size: Normal)   Pulse 60   Ht 5\' 4"  (1.626 m)   Wt 150 lb (68 kg)   LMP 11/18/2015 (Exact Date)   BMI 25.75 kg/m  Height: 5\' 4"  (162.6 cm) Ht Readings from Last 3 Encounters:  06/17/17 5\' 4"  (1.626 m)  06/16/16 5' 3.75" (1.619 m)  06/13/15 5\' 4"  (1.626 m)    General appearance: alert, cooperative and appears stated age Head: Normocephalic, without obvious abnormality, atraumatic Neck: no adenopathy, supple, symmetrical, trachea midline and thyroid normal to inspection and palpation Lungs: clear to auscultation bilaterally Breasts: normal appearance, no masses or tenderness, No nipple retraction or dimpling, No nipple discharge or bleeding, No axillary or supraclavicular adenopathy Heart: regular rate and rhythm Abdomen: soft, non-tender; no masses,  no organomegaly Extremities: extremities normal, atraumatic, no cyanosis or edema Skin: Skin color, texture, turgor normal. No rashes or lesions Lymph nodes: Cervical, supraclavicular, and axillary nodes normal. No abnormal inguinal nodes palpated Neurologic: Grossly normal   Pelvic: External genitalia:  no lesions              Urethra:  normal appearing urethra with no masses, tenderness or lesions  Bartholin's and Skene's: normal                 Vagina: normal appearing vagina with normal color and scant moisture and  discharge, no lesions              Cervix: multiparous appearance, no cervical motion tenderness and no lesions              Pap taken: No. Bimanual Exam:  Uterus:  normal size, contour, position, consistency, mobility, non-tender              Adnexa: normal adnexa and no mass, fullness, tenderness               Rectovaginal: Confirms               Anus:  normal sphincter tone, no lesions  Chaperone present: yes  A:  Well Woman with normal exam  Menopausal  no HRT  Vaginal dryness   P:   Reviewed health and wellness pertinent to exam  Aware of need to advise if vaginal bleeding  Discussed OTC coconut oil use or OTC products, does not want to use estrogen. Instructions given. Will advise if issues  Keep appt. With PCP with labs also  Pap smear: no  counseled on breast self exam, mammography screening, adequate intake of calcium and vitamin D, diet and exercise  return annually or prn  An After Visit Summary was printed and given to the patient.

## 2017-06-17 ENCOUNTER — Encounter: Payer: Self-pay | Admitting: Certified Nurse Midwife

## 2017-06-17 ENCOUNTER — Ambulatory Visit (INDEPENDENT_AMBULATORY_CARE_PROVIDER_SITE_OTHER): Payer: BLUE CROSS/BLUE SHIELD | Admitting: Certified Nurse Midwife

## 2017-06-17 VITALS — BP 124/76 | HR 60 | Ht 64.0 in | Wt 150.0 lb

## 2017-06-17 DIAGNOSIS — N951 Menopausal and female climacteric states: Secondary | ICD-10-CM | POA: Diagnosis not present

## 2017-06-17 DIAGNOSIS — N898 Other specified noninflammatory disorders of vagina: Secondary | ICD-10-CM | POA: Diagnosis not present

## 2017-06-17 DIAGNOSIS — Z01419 Encounter for gynecological examination (general) (routine) without abnormal findings: Secondary | ICD-10-CM

## 2017-06-17 NOTE — Patient Instructions (Signed)

## 2017-08-14 DIAGNOSIS — Z23 Encounter for immunization: Secondary | ICD-10-CM | POA: Diagnosis not present

## 2018-02-06 DIAGNOSIS — H5213 Myopia, bilateral: Secondary | ICD-10-CM | POA: Diagnosis not present

## 2018-05-18 ENCOUNTER — Other Ambulatory Visit: Payer: Self-pay | Admitting: Certified Nurse Midwife

## 2018-05-18 DIAGNOSIS — Z1231 Encounter for screening mammogram for malignant neoplasm of breast: Secondary | ICD-10-CM

## 2018-06-30 ENCOUNTER — Ambulatory Visit
Admission: RE | Admit: 2018-06-30 | Discharge: 2018-06-30 | Disposition: A | Discharge status: Home / Self Care | Payer: BLUE CROSS/BLUE SHIELD | Source: Ambulatory Visit | Attending: Certified Nurse Midwife | Admitting: Certified Nurse Midwife

## 2018-06-30 DIAGNOSIS — Z1231 Encounter for screening mammogram for malignant neoplasm of breast: Secondary | ICD-10-CM | POA: Diagnosis not present

## 2018-08-15 ENCOUNTER — Encounter: Payer: Self-pay | Admitting: Certified Nurse Midwife

## 2018-08-15 ENCOUNTER — Other Ambulatory Visit: Payer: Self-pay

## 2018-08-15 ENCOUNTER — Other Ambulatory Visit (HOSPITAL_COMMUNITY)
Admission: RE | Admit: 2018-08-15 | Discharge: 2018-08-15 | Disposition: A | Discharge status: Home / Self Care | Payer: BLUE CROSS/BLUE SHIELD | Source: Ambulatory Visit | Attending: Obstetrics & Gynecology | Admitting: Obstetrics & Gynecology

## 2018-08-15 ENCOUNTER — Ambulatory Visit (INDEPENDENT_AMBULATORY_CARE_PROVIDER_SITE_OTHER): Payer: BLUE CROSS/BLUE SHIELD | Admitting: Certified Nurse Midwife

## 2018-08-15 VITALS — BP 118/60 | HR 68 | Resp 16 | Ht 64.25 in | Wt 146.0 lb

## 2018-08-15 DIAGNOSIS — Z01419 Encounter for gynecological examination (general) (routine) without abnormal findings: Secondary | ICD-10-CM | POA: Diagnosis not present

## 2018-08-15 DIAGNOSIS — Z124 Encounter for screening for malignant neoplasm of cervix: Secondary | ICD-10-CM | POA: Diagnosis not present

## 2018-08-15 DIAGNOSIS — N951 Menopausal and female climacteric states: Secondary | ICD-10-CM

## 2018-08-15 NOTE — Progress Notes (Signed)
57 y.o. G43P1001 Married  Caucasian Fe here for annual exam. Menopausal with vaginal dryness which is better.No vaginal bleeding. Has PCP appointment scheduled with labs for 10/19. Not sure if TDAP due will check with PCP and advise. 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 No health  Issues today. Daughter graduates from college this year!  No LMP recorded.          Sexually active: Yes.    The current method of family planning is post menopausal status.    Exercising: Yes.    walking Smoker:  no  Review of Systems  Constitutional: Negative.   HENT: Negative.   Eyes: Negative.   Respiratory: Negative.   Cardiovascular: Negative.   Gastrointestinal: Negative.   Genitourinary: Negative.   Musculoskeletal: Negative.   Skin: Negative.   Neurological: Negative.   Endo/Heme/Allergies: Negative.   Psychiatric/Behavioral: Negative.     Health Maintenance: Pap:  06-16-16 neg HPV HR neg History of Abnormal Pap: no MMG:  06-30-18 category c density birads 1:neg Self Breast exams: yes Colonoscopy:  2016 f/u 78yrs BMD:   none TDaP:  2009, pt to check with pcp to see if she had update done Shingles: not done Pneumonia: not done Hep C and HIV: hep c neg 2017 Labs: no   reports that she has never smoked. She has never used smokeless tobacco. She reports that she drinks about 5.0 - 7.0 standard drinks of alcohol per week. She reports that she does not use drugs.  Past Medical History:  Diagnosis Date  . Hyperlipidemia     Past Surgical History:  Procedure Laterality Date  . RETINAL DETACHMENT SURGERY  2011  . TONSILLECTOMY AND ADENOIDECTOMY      Current Outpatient Medications  Medication Sig Dispense Refill  . calcium carbonate (TUMS FRESHERS) 500 MG chewable tablet Chew 1 tablet by mouth as needed for heartburn.    Marland Kitchen LORazepam (ATIVAN) 0.5 MG tablet as needed.    . Multiple Vitamins-Minerals (MULTIVITAMIN PO) Take by mouth daily.    .  Omega-3 Fatty Acids (FISH OIL) 1000 MG CAPS Take 2,000 mg by mouth daily.      No current facility-administered medications for this visit.     Family History  Problem Relation Age of Onset  . Heart disease Paternal Grandmother   . Heart disease Paternal Grandfather   . Hypertension Father   . Cancer Maternal Uncle        prostate  . Cancer Maternal Grandfather        prostate    ROS:  Pertinent items are noted in HPI.  Otherwise, a comprehensive ROS was negative.  Exam:   There were no vitals taken for this visit.   Ht Readings from Last 3 Encounters:  06/17/17 5\' 4"  (1.626 m)  06/16/16 5' 3.75" (1.619 m)  06/13/15 5\' 4"  (1.626 m)    General appearance: alert, cooperative and appears stated age Head: Normocephalic, without obvious abnormality, atraumatic Neck: no adenopathy, supple, symmetrical, trachea midline and thyroid normal to inspection and palpation Lungs: clear to auscultation bilaterally Breasts: normal appearance, no masses or tenderness, No nipple retraction or dimpling, No nipple discharge or bleeding, No axillary or supraclavicular adenopathy Heart: regular rate and rhythm Abdomen: soft, non-tender; no masses,  no organomegaly Extremities: extremities normal, atraumatic, no cyanosis or edema Skin: Skin color, texture, turgor normal. No rashes or lesions Lymph nodes: Cervical, supraclavicular, and axillary nodes normal. No abnormal inguinal nodes palpated Neurologic: Grossly normal  Pelvic: External genitalia:  no lesions, normal female              Urethra:  normal appearing urethra with no masses, tenderness or lesions              Bartholin's and Skene's: normal                 Vagina:atrophic appearing vagina with normal color and discharge, no lesions, moisture noted, very mild cystocele  noted              Cervix: multiparous appearance, no cervical motion tenderness and no lesions              Pap taken: Yes.   Bimanual Exam:  Uterus:  normal size,  contour, position, consistency, mobility, non-tender and retroverted              Adnexa: normal adnexa and no mass, fullness, tenderness               Rectovaginal: Confirms               Anus:  normal sphincter tone, no lesions  Chaperone present: yes  A:  Well Woman with normal exam  Contraception Menopausal  Vaginal dryness better with coconut oil use  Immunization ? TDAP due  P:   Reviewed health and wellness pertinent to exam  Aware of need to advise if vaginal bleeding  Continue coconut oil as indicated for dryness  Patient will check with PCP and have there or call and schedule for update here  Pap smear: yes   counseled on breast self exam, mammography screening, feminine hygiene, adequate intake of calcium and vitamin D, diet and exercise, Kegel's exercises  return annually or prn  An After Visit Summary was printed and given to the patient.

## 2018-08-15 NOTE — Patient Instructions (Signed)

## 2018-08-16 DIAGNOSIS — Z23 Encounter for immunization: Secondary | ICD-10-CM | POA: Diagnosis not present

## 2018-08-17 LAB — CYTOLOGY - PAP: DIAGNOSIS: NEGATIVE

## 2018-11-23 DIAGNOSIS — Z0000 Encounter for general adult medical examination without abnormal findings: Secondary | ICD-10-CM | POA: Diagnosis not present

## 2018-11-23 DIAGNOSIS — Z23 Encounter for immunization: Secondary | ICD-10-CM | POA: Diagnosis not present

## 2018-11-23 DIAGNOSIS — E781 Pure hyperglyceridemia: Secondary | ICD-10-CM | POA: Diagnosis not present

## 2018-11-23 DIAGNOSIS — R030 Elevated blood-pressure reading, without diagnosis of hypertension: Secondary | ICD-10-CM | POA: Diagnosis not present

## 2019-04-30 DIAGNOSIS — R7301 Impaired fasting glucose: Secondary | ICD-10-CM | POA: Diagnosis not present

## 2019-04-30 DIAGNOSIS — R030 Elevated blood-pressure reading, without diagnosis of hypertension: Secondary | ICD-10-CM | POA: Diagnosis not present

## 2019-04-30 DIAGNOSIS — E782 Mixed hyperlipidemia: Secondary | ICD-10-CM | POA: Diagnosis not present

## 2019-07-02 ENCOUNTER — Other Ambulatory Visit: Payer: Self-pay | Admitting: Family Medicine

## 2019-07-02 DIAGNOSIS — Z1231 Encounter for screening mammogram for malignant neoplasm of breast: Secondary | ICD-10-CM

## 2019-07-31 DIAGNOSIS — Z23 Encounter for immunization: Secondary | ICD-10-CM | POA: Diagnosis not present

## 2019-08-16 ENCOUNTER — Other Ambulatory Visit: Payer: Self-pay

## 2019-08-16 ENCOUNTER — Ambulatory Visit
Admission: RE | Admit: 2019-08-16 | Discharge: 2019-08-16 | Disposition: A | Discharge status: Home / Self Care | Payer: BC Managed Care – PPO | Source: Ambulatory Visit | Attending: Family Medicine | Admitting: Family Medicine

## 2019-08-16 DIAGNOSIS — Z1231 Encounter for screening mammogram for malignant neoplasm of breast: Secondary | ICD-10-CM

## 2019-08-28 NOTE — Progress Notes (Signed)
58 y.o. G61P1001 Married  Caucasian Fe here for annual exam.Post menopausal no HRT. Denies vaginal bleeding or vaginal dryness.Has been a good year. Saw Dr.Hagler PCP for aex and labs. Cholesterol is better due to regular exercise and good diet. 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 No other health issues today.  Patient's last menstrual period was 11/18/2015 (exact date).          Sexually active: Yes.    The current method of family planning is post menopausal status.    Exercising: Yes.    walking Smoker:  no  Review of Systems  Constitutional: Negative.   HENT: Negative.   Eyes: Negative.   Respiratory: Negative.   Cardiovascular: Negative.   Gastrointestinal: Negative.   Genitourinary: Negative.   Musculoskeletal: Negative.   Skin: Negative.   Neurological: Negative.   Endo/Heme/Allergies: Negative.   Psychiatric/Behavioral: Negative.     Health Maintenance: Pap:  06-16-16 neg HPV HR neg, 08-15-18 neg History of Abnormal Pap: no MMG:  08-16-19 category c density birads 1:neg Self Breast exams: yes Colonoscopy:  2016 f/u 29yrs BMD:   none TDaP:  2020 Shingles: not done Pneumonia: not done Hep C and HIV: hep c neg 2017 Labs:if needed, had with PCP   reports that she has never smoked. She has never used smokeless tobacco. She reports current alcohol use of about 5.0 - 7.0 standard drinks of alcohol per week. She reports that she does not use drugs.  Past Medical History:  Diagnosis Date  . Hyperlipidemia     Past Surgical History:  Procedure Laterality Date  . RETINAL DETACHMENT SURGERY  2011  . TONSILLECTOMY AND ADENOIDECTOMY      Current Outpatient Medications  Medication Sig Dispense Refill  . Calcium Citrate-Vitamin D (CALCIUM + D PO) Take by mouth.    . Multiple Vitamins-Minerals (MULTIVITAMIN PO) Take by mouth daily.    . Omega-3 Fatty Acids (FISH OIL) 1000 MG CAPS Take 2,000 mg by mouth daily.       No current facility-administered medications for this visit.     Family History  Problem Relation Age of Onset  . Heart disease Paternal Grandmother   . Heart disease Paternal Grandfather   . Hypertension Father   . Cancer Maternal Uncle        prostate  . Cancer Maternal Grandfather        prostate    ROS:  Pertinent items are noted in HPI.  Otherwise, a comprehensive ROS was negative.  Exam:   BP 122/72   Pulse 68   Temp (!) 97.1 F (36.2 C) (Skin)   Resp 16   Ht 5' 3.75" (1.619 m)   Wt 137 lb (62.1 kg)   LMP 11/18/2015 (Exact Date)   BMI 23.70 kg/m  Height: 5' 3.75" (161.9 cm) Ht Readings from Last 3 Encounters:  08/29/19 5' 3.75" (1.619 m)  08/15/18 5' 4.25" (1.632 m)  06/17/17 5\' 4"  (1.626 m)    General appearance: alert, cooperative and appears stated age Head: Normocephalic, without obvious abnormality, atraumatic Neck: no adenopathy, supple, symmetrical, trachea midline and thyroid normal to inspection and palpation Lungs: clear to auscultation bilaterally Breasts: normal appearance, no masses or tenderness, No nipple retraction or dimpling, No nipple discharge or bleeding, No axillary or supraclavicular adenopathy, stressed SBE Heart: regular rate and rhythm Abdomen: soft, non-tender; no masses,  no organomegaly Extremities: extremities normal, atraumatic, no cyanosis or edema Skin: Skin color, texture, turgor normal.  No rashes or lesions Lymph nodes: Cervical, supraclavicular, and axillary nodes normal. No abnormal inguinal nodes palpated Neurologic: Grossly normal   Pelvic: External genitalia:  no lesions, normal female              Urethra:  normal appearing urethra with no masses, tenderness or lesions              Bartholin's and Skene's: normal                 Vagina: normal appearing vagina with normal color and discharge, no lesions              Cervix: no cervical motion tenderness, no lesions and normal appearance              Pap taken:  No. Bimanual Exam:  Uterus:  normal size, contour, position, consistency, mobility, non-tender and anteverted              Adnexa: normal adnexa and no mass, fullness, tenderness               Rectovaginal: Confirms               Anus:  normal sphincter tone, no lesions  Chaperone present: yes  A:  Well Woman with normal exam  Menopausal no HRT  Cholesterol/labs management with PCP    P:   Reviewed health and wellness pertinent to exam.  Aware to advise if vaginal bleeding.  Continue with PCP as indicated.    Pap smear: no  counseled on breast self exam, mammography screening, feminine hygiene, menopause, adequate intake of calcium and vitamin D, diet and exercise, Flu vaccine  return annually or prn  An After Visit Summary was printed and given to the patient.

## 2019-08-29 ENCOUNTER — Ambulatory Visit (INDEPENDENT_AMBULATORY_CARE_PROVIDER_SITE_OTHER): Payer: BC Managed Care – PPO | Admitting: Certified Nurse Midwife

## 2019-08-29 ENCOUNTER — Encounter: Payer: Self-pay | Admitting: Certified Nurse Midwife

## 2019-08-29 ENCOUNTER — Other Ambulatory Visit: Payer: Self-pay

## 2019-08-29 VITALS — BP 122/72 | HR 68 | Temp 97.1°F | Resp 16 | Ht 63.75 in | Wt 137.0 lb

## 2019-08-29 DIAGNOSIS — Z01419 Encounter for gynecological examination (general) (routine) without abnormal findings: Secondary | ICD-10-CM | POA: Diagnosis not present

## 2019-08-29 DIAGNOSIS — Z780 Asymptomatic menopausal state: Secondary | ICD-10-CM

## 2019-08-29 NOTE — Patient Instructions (Signed)

## 2020-02-11 ENCOUNTER — Encounter: Payer: Self-pay | Admitting: Certified Nurse Midwife

## 2020-05-19 DIAGNOSIS — H35413 Lattice degeneration of retina, bilateral: Secondary | ICD-10-CM | POA: Diagnosis not present

## 2020-07-22 ENCOUNTER — Other Ambulatory Visit: Payer: Self-pay | Admitting: Family Medicine

## 2020-07-22 DIAGNOSIS — Z1231 Encounter for screening mammogram for malignant neoplasm of breast: Secondary | ICD-10-CM

## 2020-08-18 ENCOUNTER — Ambulatory Visit
Admission: RE | Admit: 2020-08-18 | Discharge: 2020-08-18 | Disposition: A | Discharge status: Home / Self Care | Payer: BC Managed Care – PPO | Source: Ambulatory Visit | Attending: Family Medicine | Admitting: Family Medicine

## 2020-08-18 ENCOUNTER — Other Ambulatory Visit: Payer: Self-pay

## 2020-08-18 DIAGNOSIS — Z1231 Encounter for screening mammogram for malignant neoplasm of breast: Secondary | ICD-10-CM

## 2021-01-27 ENCOUNTER — Ambulatory Visit: Payer: BC Managed Care – PPO | Admitting: Obstetrics and Gynecology

## 2021-02-06 ENCOUNTER — Ambulatory Visit (INDEPENDENT_AMBULATORY_CARE_PROVIDER_SITE_OTHER): Payer: BC Managed Care – PPO | Admitting: Obstetrics and Gynecology

## 2021-02-06 ENCOUNTER — Other Ambulatory Visit: Payer: Self-pay

## 2021-02-06 ENCOUNTER — Encounter: Payer: Self-pay | Admitting: Obstetrics and Gynecology

## 2021-02-06 VITALS — BP 128/80 | HR 88 | Ht 63.75 in | Wt 138.0 lb

## 2021-02-06 DIAGNOSIS — Z01419 Encounter for gynecological examination (general) (routine) without abnormal findings: Secondary | ICD-10-CM

## 2021-02-06 NOTE — Patient Instructions (Signed)

## 2021-02-06 NOTE — Progress Notes (Signed)
60 y.o. G80P1001 Married Caucasian female here for annual exam.    Occasional hot flash.   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   Received her Covid vaccine x 2 with [employer].  No booster.  Feels like her joints are not back to normal.  PCP: Aliene Beams, MD     Patient's last menstrual period was 11/18/2015 (exact date).           Sexually active: Yes.    The current method of family planning is post menopausal status.    Exercising: Yes.    walking Smoker:  no  Health Maintenance: Pap:  08/15/18 Neg  06/16/16 Neg:Neg HR HPV History of abnormal Pap:  no MMG:  08/18/20 BIRADS 1 negative/density d Colonoscopy:  2016 f/u 10 years BMD:   never TDaP:  2020 Gardasil:   n/a HIV and Hep C: Neg in 2017 Screening Labs:  PCP   reports that she has never smoked. She has never used smokeless tobacco. She reports current alcohol use of about 5.0 - 7.0 standard drinks of alcohol per week. She reports that she does not use drugs.  Past Medical History:  Diagnosis Date  . Hyperlipidemia     Past Surgical History:  Procedure Laterality Date  . RETINAL DETACHMENT SURGERY  2011  . TONSILLECTOMY AND ADENOIDECTOMY      Current Outpatient Medications  Medication Sig Dispense Refill  . Calcium Citrate-Vitamin D (CALCIUM + D PO) Take by mouth.    . Multiple Vitamins-Minerals (MULTIVITAMIN PO) Take by mouth daily.    . Omega-3 Fatty Acids (FISH OIL) 1000 MG CAPS Take 2,000 mg by mouth daily.      No current facility-administered medications for this visit.    Family History  Problem Relation Age of Onset  . Heart disease Paternal Grandmother   . Heart disease Paternal Grandfather   . Hypertension Father   . Cancer Maternal Uncle        prostate  . Cancer Maternal Grandfather        prostate    Review of Systems  Constitutional: Negative.   HENT: Negative.   Eyes: Negative.   Respiratory: Negative.   Cardiovascular: Negative.   Gastrointestinal:  Negative.   Endocrine: Negative.   Genitourinary: Negative.   Musculoskeletal: Negative.   Skin: Negative.   Allergic/Immunologic: Negative.   Neurological: Negative.   Hematological: Negative.   Psychiatric/Behavioral: Negative.     Exam:   BP 128/80   Pulse 88   Ht 5' 3.75" (1.619 m)   Wt 138 lb (62.6 kg)   LMP 11/18/2015 (Exact Date)   SpO2 98%   BMI 23.87 kg/m     General appearance: alert, cooperative and appears stated age Head: normocephalic, without obvious abnormality, atraumatic Neck: no adenopathy, supple, symmetrical, trachea midline and thyroid normal to inspection and palpation Lungs: clear to auscultation bilaterally Breasts: normal appearance, no masses or tenderness, No nipple retraction or dimpling, No nipple discharge or bleeding, No axillary adenopathy Heart: regular rate and rhythm Abdomen: soft, non-tender; no masses, no organomegaly Extremities: extremities normal, atraumatic, no cyanosis or edema Skin: skin color, texture, turgor normal. No rashes or lesions Lymph nodes: cervical, supraclavicular, and axillary nodes normal. Neurologic: grossly normal  Pelvic: External genitalia:  no lesions              No abnormal inguinal nodes palpated.              Urethra:  normal appearing urethra with no  masses, tenderness or lesions              Bartholins and Skenes: normal                 Vagina: normal appearing vagina with normal color and discharge, no lesions              Cervix: no lesions              Pap taken: No. Bimanual Exam:  Uterus:  normal size, contour, position, consistency, mobility, non-tender              Adnexa: no mass, fullness, tenderness              Rectal exam: Yes.  .  Confirms.              Anus:  normal sphincter tone, no lesions  Chaperone was present for exam.  Assessment:   Well woman visit with normal exam. Hyperlipidemia.   Plan: Mammogram screening discussed. Self breast awareness reviewed. Pap and HR HPV as  above. Guidelines for Calcium, Vitamin D, regular exercise program including cardiovascular and weight bearing exercise. Mediterranean diet reviewed.  Follow up annually and prn.

## 2021-07-17 ENCOUNTER — Other Ambulatory Visit: Payer: Self-pay | Admitting: Family Medicine

## 2021-07-17 DIAGNOSIS — Z1231 Encounter for screening mammogram for malignant neoplasm of breast: Secondary | ICD-10-CM

## 2021-08-27 ENCOUNTER — Ambulatory Visit
Admission: RE | Admit: 2021-08-27 | Discharge: 2021-08-27 | Disposition: A | Discharge status: Home / Self Care | Payer: BC Managed Care – PPO | Source: Ambulatory Visit | Attending: Family Medicine | Admitting: Family Medicine

## 2021-08-27 ENCOUNTER — Other Ambulatory Visit: Payer: Self-pay

## 2021-08-27 DIAGNOSIS — Z1231 Encounter for screening mammogram for malignant neoplasm of breast: Secondary | ICD-10-CM | POA: Diagnosis not present

## 2022-02-08 ENCOUNTER — Ambulatory Visit (INDEPENDENT_AMBULATORY_CARE_PROVIDER_SITE_OTHER): Payer: 59 | Admitting: Obstetrics and Gynecology

## 2022-02-08 ENCOUNTER — Other Ambulatory Visit: Payer: Self-pay

## 2022-02-08 ENCOUNTER — Other Ambulatory Visit (HOSPITAL_COMMUNITY)
Admission: RE | Admit: 2022-02-08 | Discharge: 2022-02-08 | Disposition: A | Discharge status: Home / Self Care | Payer: 59 | Source: Ambulatory Visit | Attending: Obstetrics and Gynecology | Admitting: Obstetrics and Gynecology

## 2022-02-08 ENCOUNTER — Encounter: Payer: Self-pay | Admitting: Obstetrics and Gynecology

## 2022-02-08 VITALS — Ht 63.5 in | Wt 146.0 lb

## 2022-02-08 DIAGNOSIS — Z01419 Encounter for gynecological examination (general) (routine) without abnormal findings: Secondary | ICD-10-CM

## 2022-02-08 DIAGNOSIS — Z124 Encounter for screening for malignant neoplasm of cervix: Secondary | ICD-10-CM | POA: Diagnosis present

## 2022-02-08 NOTE — Progress Notes (Signed)
61 y.o. G11P1001 Married Caucasian female here for annual exam.   ? ?PCP:  Aliene Beams, MD  ? ?Patient's last menstrual period was 11/18/2015 (exact date).     ?  ?    ?Sexually active: Yes.    ?The current method of family planning is post menopausal status.    ?Exercising: Yes.     walking ?Smoker:  no ? ?Health Maintenance: ?Pap:  08/15/18 Neg, 06/16/16 Neg:Neg HR HPV, 05-16-14 Neg ?History of abnormal Pap:  no ?MMG:  08-27-21 Neg/BiRads1 ?Colonoscopy:   2016 f/u 10 years ?BMD:   normal just prior to age 89 yo.  ?TDaP:  2020 ?Gardasil:   n/a ?HIV: 2017 Neg ?Hep C: 2017 Neg ?Screening Labs:  PCP ?Shingrix:  Plans to do this summer at pharmacy. ? ? reports that she has never smoked. She has never used smokeless tobacco. She reports current alcohol use of about 5.0 - 7.0 standard drinks per week. She reports that she does not use drugs. ? ?Past Medical History:  ?Diagnosis Date  ? Hyperlipidemia   ? ? ?Past Surgical History:  ?Procedure Laterality Date  ? RETINAL DETACHMENT SURGERY  2011  ? TONSILLECTOMY AND ADENOIDECTOMY    ? ? ?Current Outpatient Medications  ?Medication Sig Dispense Refill  ? Calcium Citrate-Vitamin D (CALCIUM + D PO) Take by mouth.    ? Multiple Vitamins-Minerals (MULTIVITAMIN PO) Take by mouth daily.    ? Omega-3 Fatty Acids (FISH OIL) 1000 MG CAPS Take 2,000 mg by mouth daily.     ? ?No current facility-administered medications for this visit.  ? ? ?Family History  ?Problem Relation Age of Onset  ? Heart disease Paternal Grandmother   ? Heart disease Paternal Grandfather   ? Hypertension Father   ? Cancer Maternal Uncle   ?     prostate  ? Cancer Maternal Grandfather   ?     prostate  ? ? ?Review of Systems  ?All other systems reviewed and are negative. ? ?Exam:   ?Ht 5' 3.5" (1.613 m)   Wt 146 lb (66.2 kg)   LMP 11/18/2015 (Exact Date)   BMI 25.46 kg/m?     ?General appearance: alert, cooperative and appears stated age ?Head: normocephalic, without obvious abnormality, atraumatic ?Neck: no  adenopathy, supple, symmetrical, trachea midline and thyroid normal to inspection and palpation ?Lungs: clear to auscultation bilaterally ?Breasts: normal appearance, no masses or tenderness, No nipple retraction or dimpling, No nipple discharge or bleeding, No axillary adenopathy ?Heart: regular rate and rhythm ?Abdomen: soft, non-tender; no masses, no organomegaly ?Extremities: extremities normal, atraumatic, no cyanosis or edema ?Skin: skin color, texture, turgor normal. No rashes or lesions ?Lymph nodes: cervical, supraclavicular, and axillary nodes normal. ?Neurologic: grossly normal ? ?Pelvic: External genitalia:  no lesions ?             No abnormal inguinal nodes palpated. ?             Urethra:  normal appearing urethra with no masses, tenderness or lesions ?             Bartholins and Skenes: normal    ?             Vagina: normal appearing vagina with normal color and discharge, no lesions ?             Cervix: no lesions ?             Pap taken: yes ?Bimanual Exam:  Uterus:  normal size, contour,  position, consistency, mobility, non-tender ?             Adnexa: no mass, fullness, tenderness ?             Rectal exam: yes.  Confirms. ?             Anus:  normal sphincter tone, no lesions ? ?Chaperone was present for exam:  Kimalexis, CMA ? ?Assessment:   ?Well woman visit with gynecologic exam. ? ? ?Plan: ?Mammogram screening discussed. ?Self breast awareness reviewed. ?Pap and HR HPV collected. ?Guidelines for Calcium, Vitamin D, regular exercise program including cardiovascular and weight bearing exercise. ?Labs with PCP.  ?Follow up annually and prn.  ? ?After visit summary provided.  ? ? ? ?

## 2022-02-08 NOTE — Patient Instructions (Signed)

## 2022-02-10 LAB — CYTOLOGY - PAP
Comment: NEGATIVE
Diagnosis: NEGATIVE
High risk HPV: NEGATIVE

## 2022-03-16 IMAGING — MG MM DIGITAL SCREENING BILAT W/ TOMO AND CAD
8 series · 9 of 24 positions shown · non-contrast
Comparison: Previous exam(s).

CLINICAL DATA: Screening.

EXAM:
DIGITAL SCREENING BILATERAL MAMMOGRAM WITH TOMOSYNTHESIS AND CAD
TECHNIQUE: Bilateral screening digital craniocaudal and mediolateral oblique
mammograms were obtained. Bilateral screening digital breast
tomosynthesis was performed. The images were evaluated with
computer-aided detection.

[L MLO synth-2D]
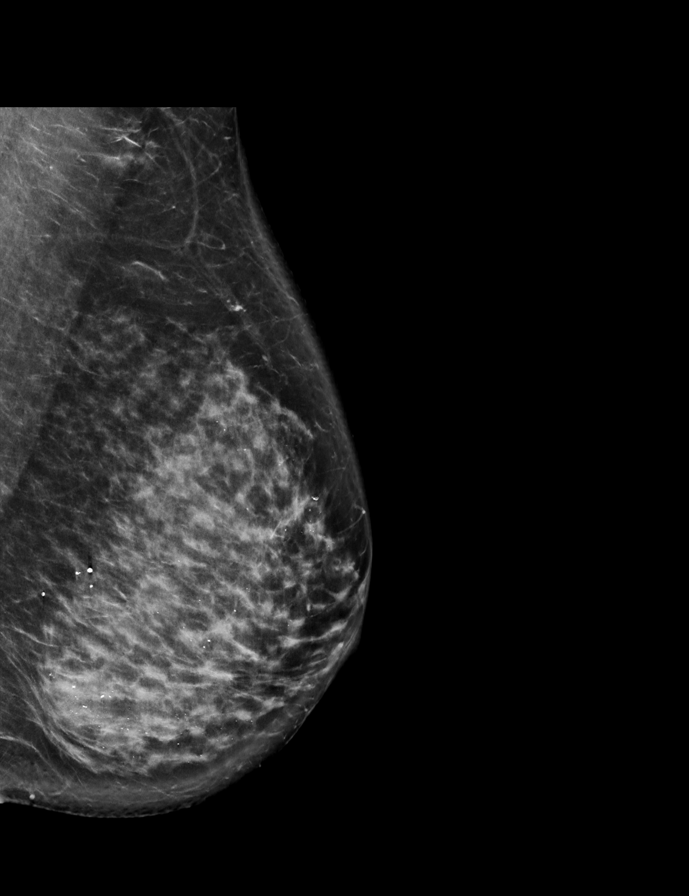

[R MLO synth-2D]
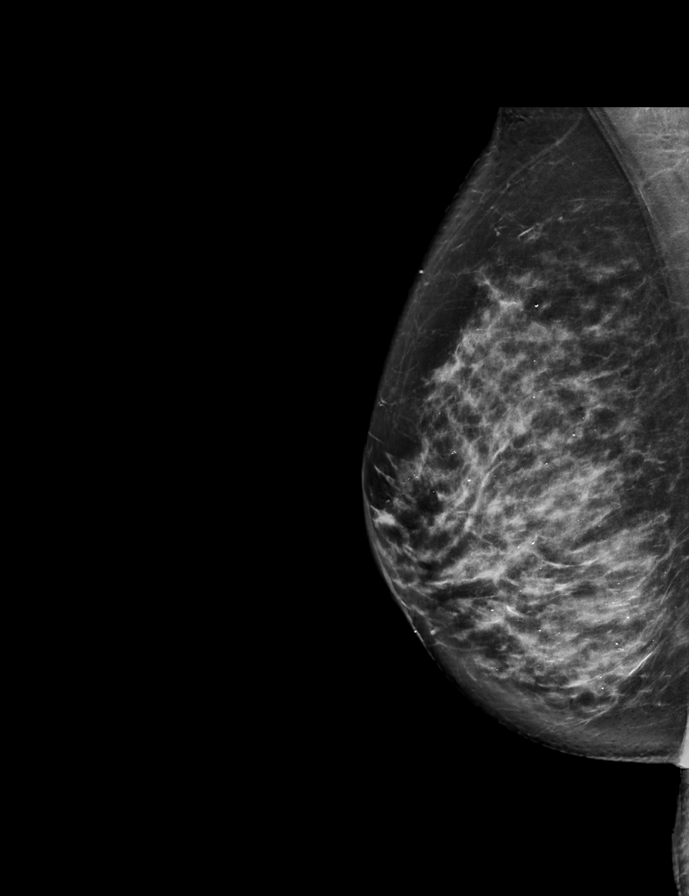

[R CC synth-2D]
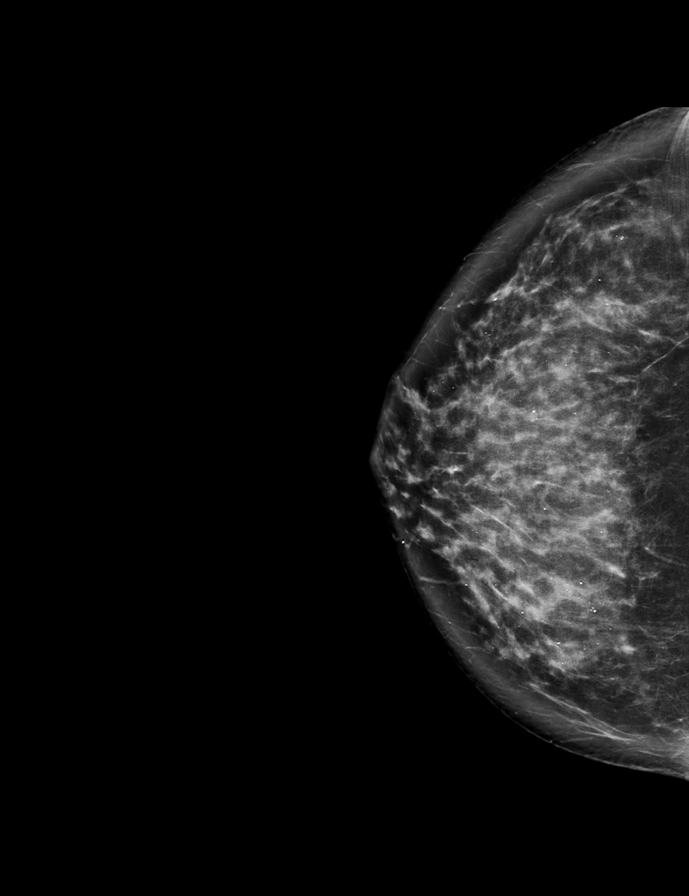

[L CC synth-2D]
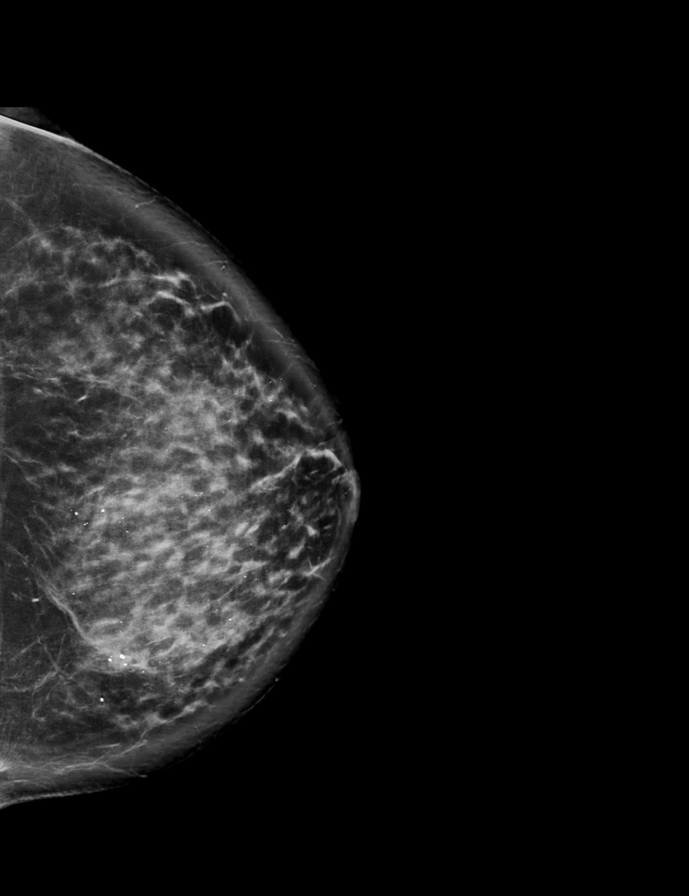

[L CC tomo · 2 of 87 frames shown]
[frame 29/87]
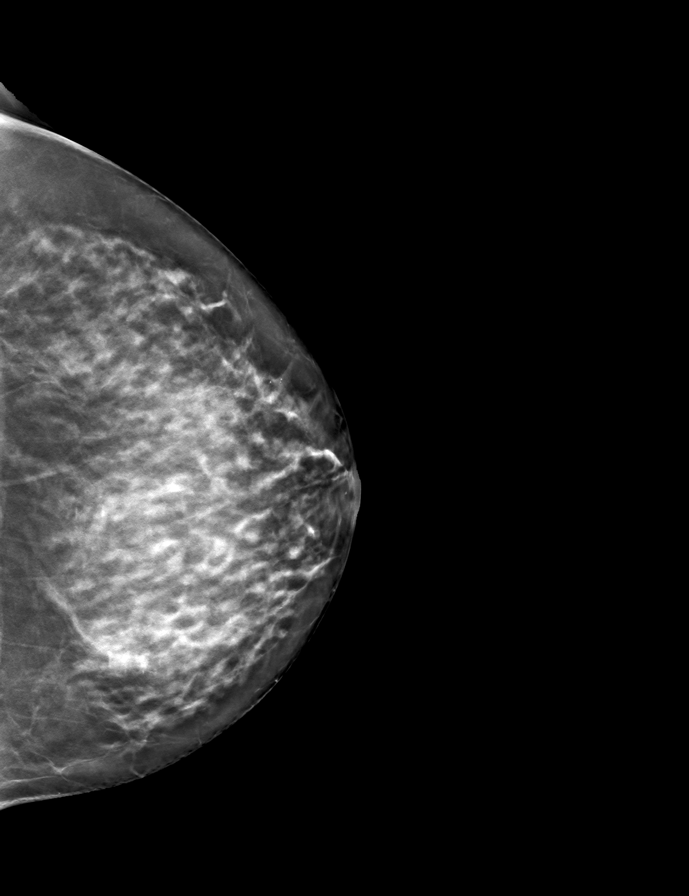
[frame 44/87]
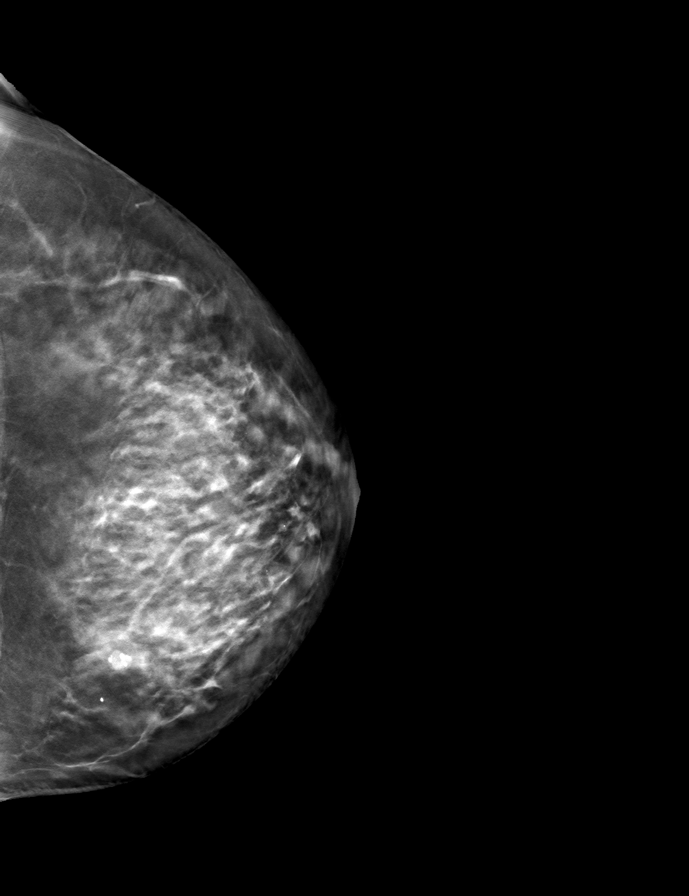

[R CC tomo · tomo slice 41/81.0]
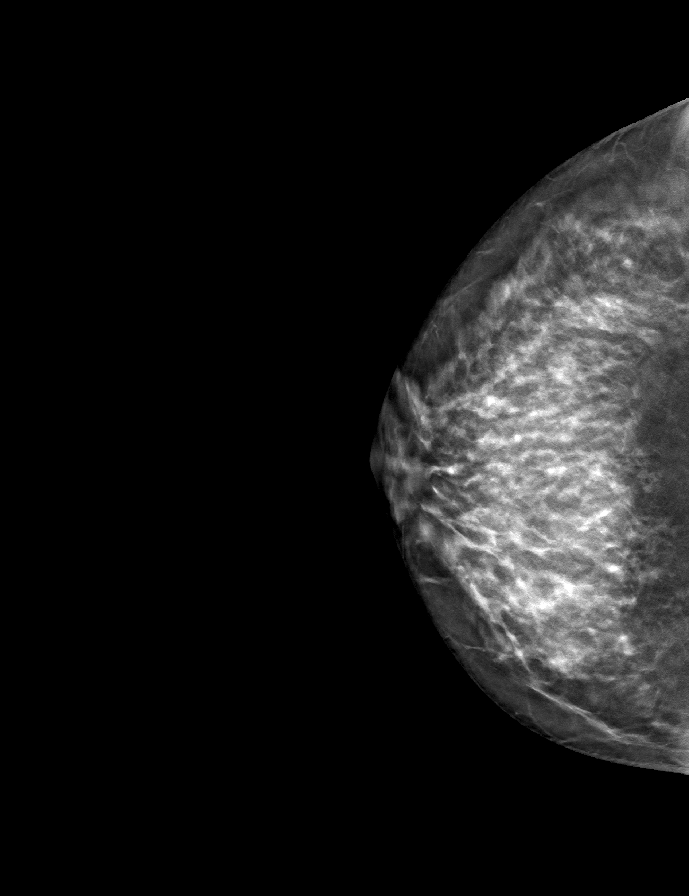

[R MLO tomo · tomo slice 43/84.0]
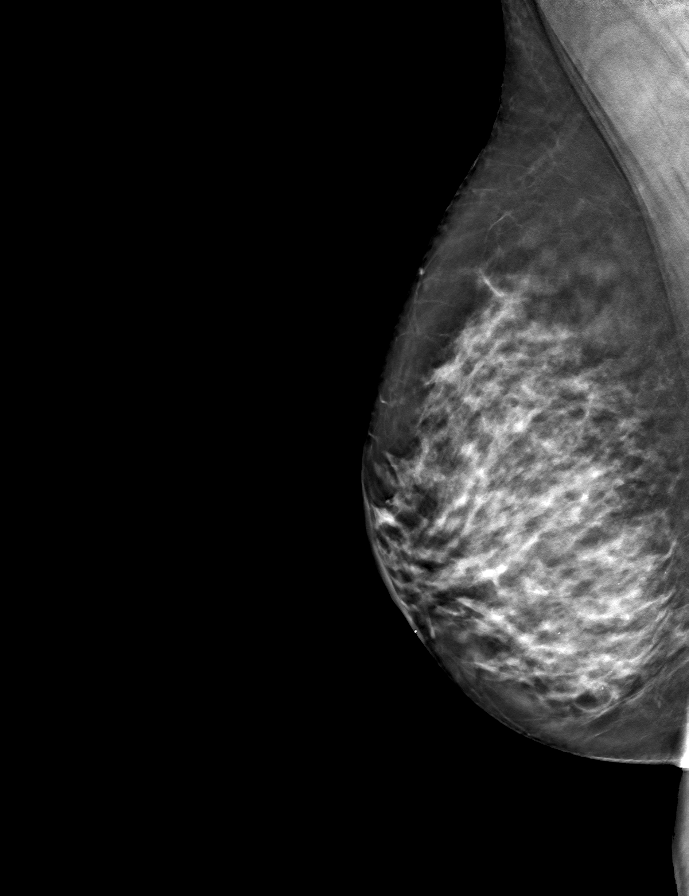

[L MLO tomo · tomo slice 41/82.0]
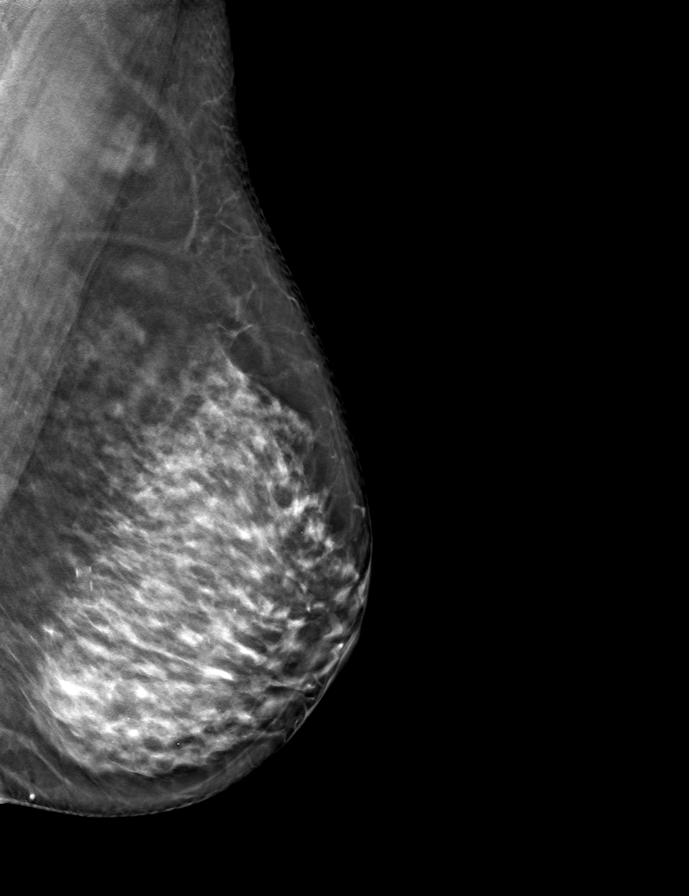

[9 of 24 positions shown; findings below may reference images not displayed]

ACR Breast Density Category d: The breast tissue is extremely dense,
which lowers the sensitivity of mammography
FINDINGS: There are no findings suspicious for malignancy.
IMPRESSION: No mammographic evidence of malignancy. A result letter of this
screening mammogram will be mailed directly to the patient.

RECOMMENDATION:
Screening mammogram in one year. (Code:TA-[id number])

BI-RADS CATEGORY  1: Negative.

## 2022-07-28 ENCOUNTER — Other Ambulatory Visit: Payer: Self-pay | Admitting: Family Medicine

## 2022-07-28 DIAGNOSIS — Z1231 Encounter for screening mammogram for malignant neoplasm of breast: Secondary | ICD-10-CM

## 2022-08-30 ENCOUNTER — Ambulatory Visit
Admission: RE | Admit: 2022-08-30 | Discharge: 2022-08-30 | Disposition: A | Discharge status: Home / Self Care | Payer: 59 | Source: Ambulatory Visit | Attending: Family Medicine | Admitting: Family Medicine

## 2022-08-30 DIAGNOSIS — Z1231 Encounter for screening mammogram for malignant neoplasm of breast: Secondary | ICD-10-CM

## 2023-01-17 HISTORY — PX: PERCUTANEOUS FIXATION TIBIAL SHAFT FRACTURE W/ PINS / SCREWS: SUR1009

## 2023-01-27 NOTE — Progress Notes (Signed)
62 y.o. G42P1001 Married Caucasian female here for annual exam.    No GYN concerns.  Fell while on a cruise and broke her left middle finger.   PCP:   Maria Macadam, MD  Patient's last menstrual period was 11/18/2015 (exact date).           Sexually active: Yes.    The current method of family planning is post menopausal status.    Exercising: Yes.     walking Smoker:  no  Health Maintenance: Pap:  02/08/22 neg: HR HPV neg, 08/15/18 Neg, 06/16/16 Neg:Neg HR HPV, 05-16-14 Neg  History of abnormal Pap:  no MMG:  08/30/22 Breast Density Category D, BI-RADS CATEGORY 1 Neg Colonoscopy:  2016 f/u 10 years BMD:   n/a  Result  n/a TDaP:  2020 Gardasil:   no HIV: 2017 neg Hep C: 2017 neg Screening Labs:   Shingrix:  completed.   reports that she has never smoked. She has never used smokeless tobacco. She reports current alcohol use of about 5.0 - 7.0 standard drinks of alcohol per week. She reports that she does not use drugs.  Past Medical History:  Diagnosis Date   Hyperlipidemia     Past Surgical History:  Procedure Laterality Date   PERCUTANEOUS FIXATION TIBIAL SHAFT FRACTURE W/ PINS / SCREWS Left 01/17/2023   Left middle finger   RETINAL DETACHMENT SURGERY  11/22/2009   TONSILLECTOMY AND ADENOIDECTOMY      Current Outpatient Medications  Medication Sig Dispense Refill   Ascorbic Acid (VITAMIN C) 1000 MG tablet Take 1,000 mg by mouth daily.     Calcium Citrate-Vitamin D (CALCIUM + D PO) Take by mouth.     Multiple Vitamins-Minerals (MULTIVITAMIN PO) Take by mouth daily.     Omega-3 Fatty Acids (FISH OIL) 1000 MG CAPS Take 2,000 mg by mouth daily.      pyridOXINE (VITAMIN B6) 100 MG tablet Take 100 mg by mouth daily.     No current facility-administered medications for this visit.    Family History  Problem Relation Age of Onset   Heart disease Paternal Grandmother    Heart disease Paternal Grandfather    Hypertension Father    Cancer Maternal Uncle        prostate    Cancer Maternal Grandfather        prostate    Review of Systems  All other systems reviewed and are negative.   Exam:   BP 128/84 (BP Location: Right Arm, Patient Position: Sitting, Cuff Size: Normal)   Pulse 89   Ht 5\' 4"  (1.626 m)   Wt 150 lb (68 kg)   LMP 11/18/2015 (Exact Date)   SpO2 99%   BMI 25.75 kg/m     General appearance: alert, cooperative and appears stated age Head: normocephalic, without obvious abnormality, atraumatic Neck: no adenopathy, supple, symmetrical, trachea midline and thyroid normal to inspection and palpation Lungs: clear to auscultation bilaterally Breasts: normal appearance, no masses or tenderness, No nipple retraction or dimpling, No nipple discharge or bleeding, No axillary adenopathy Heart: regular rate and rhythm Abdomen: soft, non-tender; no masses, no organomegaly Extremities: extremities normal, atraumatic, no cyanosis or edema Skin: skin color, texture, turgor normal. No rashes or lesions Lymph nodes: cervical, supraclavicular, and axillary nodes normal. Neurologic: grossly normal  Pelvic: External genitalia:  no lesions              No abnormal inguinal nodes palpated.  Urethra:  normal appearing urethra with no masses, tenderness or lesions              Bartholins and Skenes: normal                 Vagina: normal appearing vagina with normal color and discharge, no lesions              Cervix: no lesions              Pap taken: no Bimanual Exam:  Uterus:  normal size, contour, position, consistency, mobility, non-tender              Adnexa: no mass, fullness, tenderness              Rectal exam: yes.  Confirms.              Anus:  normal sphincter tone, no lesions  Chaperone was present for exam:  Emily  Assessment:   Well woman visit with gynecologic exam.  Plan: Mammogram screening discussed. Self breast awareness reviewed. Pap and HR HPV 2028. Guidelines for Calcium, Vitamin D, regular exercise program  including cardiovascular and weight bearing exercise.   Follow up annually and prn.   After visit summary provided.

## 2023-02-10 ENCOUNTER — Encounter: Payer: Self-pay | Admitting: Obstetrics and Gynecology

## 2023-02-10 ENCOUNTER — Ambulatory Visit (INDEPENDENT_AMBULATORY_CARE_PROVIDER_SITE_OTHER): Payer: 59 | Admitting: Obstetrics and Gynecology

## 2023-02-10 VITALS — BP 128/84 | HR 89 | Ht 64.0 in | Wt 150.0 lb

## 2023-02-10 DIAGNOSIS — Z01419 Encounter for gynecological examination (general) (routine) without abnormal findings: Secondary | ICD-10-CM

## 2023-02-10 NOTE — Patient Instructions (Signed)

## 2023-08-01 ENCOUNTER — Other Ambulatory Visit: Payer: Self-pay | Admitting: Family Medicine

## 2023-08-01 DIAGNOSIS — Z1231 Encounter for screening mammogram for malignant neoplasm of breast: Secondary | ICD-10-CM

## 2023-09-13 ENCOUNTER — Ambulatory Visit: Payer: Self-pay

## 2023-09-15 ENCOUNTER — Ambulatory Visit
Admission: RE | Admit: 2023-09-15 | Discharge: 2023-09-15 | Disposition: A | Discharge status: Home / Self Care | Payer: Managed Care, Other (non HMO) | Source: Ambulatory Visit | Attending: Family Medicine | Admitting: Family Medicine

## 2023-09-15 DIAGNOSIS — Z1231 Encounter for screening mammogram for malignant neoplasm of breast: Secondary | ICD-10-CM

## 2024-03-08 NOTE — Progress Notes (Signed)
 64 y.o. G25P1001 Married Caucasian female here for annual exam.    Denies vaginal bleeding, discharge, bladder or bowel concerns, or breast concerns.   No concern with sexual functioning.   PCP: Dorena Gander, MD   Patient's last menstrual period was 11/18/2015 (exact date).           Sexually active: Yes.    The current method of family planning is post menopausal status.    Menopausal hormone therapy:  n/a Exercising: Yes.     Low impact workouts and walking Smoker:  no  OB History  Gravida Para Term Preterm AB Living  1 1 1  0 0 1  SAB IAB Ectopic Multiple Live Births  0 0 0 0 1    # Outcome Date GA Lbr Len/2nd Weight Sex Type Anes PTL Lv  1 Term     F Vag-Spont   LIV     HEALTH MAINTENANCE: Last 2 paps:  02/08/22 neg, neg HR HPV, 08/15/18 neg  History of abnormal Pap or positive HPV:  no Mammogram:   09/15/23 BIRADS cat 1 neg  Colonoscopy:  09/12/15 - normal.  Bone Density:  n/a  Result  n/a   Immunization History  Administered Date(s) Administered   Tdap 11/28/2018      reports that she has never smoked. She has never used smokeless tobacco. She reports current alcohol use of about 5.0 - 7.0 standard drinks of alcohol per week. She reports that she does not use drugs.  Past Medical History:  Diagnosis Date   Hyperlipidemia     Past Surgical History:  Procedure Laterality Date   PERCUTANEOUS FIXATION TIBIAL SHAFT FRACTURE W/ PINS / SCREWS Left 01/17/2023   Left middle finger   RETINAL DETACHMENT SURGERY  11/22/2009   TONSILLECTOMY AND ADENOIDECTOMY      Current Outpatient Medications  Medication Sig Dispense Refill   Multiple Vitamins-Minerals (MULTIVITAMIN PO) Take by mouth daily.     Omega-3 Fatty Acids (FISH OIL) 1000 MG CAPS Take 2,000 mg by mouth daily.      No current facility-administered medications for this visit.    ALLERGIES: Patient has no known allergies.  Family History  Problem Relation Age of Onset   Heart disease Paternal  Grandmother    Heart disease Paternal Grandfather    Hypertension Father    Cancer Maternal Uncle        prostate   Cancer Maternal Grandfather        prostate    Review of Systems  All other systems reviewed and are negative.   PHYSICAL EXAM:  BP 126/84 (BP Location: Left Arm, Patient Position: Sitting, Cuff Size: Normal)   Pulse 85   Ht 5' 4.5" (1.638 m)   Wt 152 lb (68.9 kg)   LMP 11/18/2015 (Exact Date)   SpO2 97%   BMI 25.69 kg/m     General appearance: alert, cooperative and appears stated age Head: normocephalic, without obvious abnormality, atraumatic Neck: no adenopathy, supple, symmetrical, trachea midline and thyroid normal to inspection and palpation Lungs: clear to auscultation bilaterally Breasts: normal appearance, no masses or tenderness, No nipple retraction or dimpling, No nipple discharge or bleeding, No axillary adenopathy Heart: regular rate and rhythm Abdomen: soft, non-tender; no masses, no organomegaly Extremities: extremities normal, atraumatic, no cyanosis or edema Skin: skin color, texture, turgor normal. Multiple flat pigmented nevi.  Lymph nodes: cervical, supraclavicular, and axillary nodes normal. Neurologic: grossly normal  Pelvic: External genitalia:  no lesions  No abnormal inguinal nodes palpated.              Urethra:  normal appearing urethra with no masses, tenderness or lesions              Bartholins and Skenes: normal                 Vagina: normal appearing vagina with normal color and discharge, no lesions              Cervix: no lesions              Pap taken: No. Bimanual Exam:  Uterus:  normal size, contour, position, consistency, mobility, non-tender              Adnexa: no mass, fullness, tenderness              Rectal exam: Yes.  .  Confirms.              Anus:  normal sphincter tone, no lesions  Chaperone was present for exam:   Cottie Diss, CMA  ASSESSMENT: Well woman visit with gynecologic exam. Multiple flat  pigmented nevi. PHQ-9: 0  PLAN: Mammogram screening discussed. Self breast awareness reviewed. Pap and HRV collected:  no.  Due in 2028. Guidelines for Calcium, Vitamin D , regular exercise program including cardiovascular and weight bearing exercise. Medication refills:  NA Labs with PCP.  She will make an appointment to see her dermatologist for a routine skin check.  Follow up:  yearly and prn.

## 2024-03-12 ENCOUNTER — Ambulatory Visit: Payer: 59 | Admitting: Obstetrics and Gynecology

## 2024-03-15 ENCOUNTER — Encounter: Payer: Self-pay | Admitting: Obstetrics and Gynecology

## 2024-03-15 ENCOUNTER — Ambulatory Visit (INDEPENDENT_AMBULATORY_CARE_PROVIDER_SITE_OTHER): Admitting: Obstetrics and Gynecology

## 2024-03-15 VITALS — BP 126/84 | HR 85 | Ht 64.5 in | Wt 152.0 lb

## 2024-03-15 DIAGNOSIS — Z01419 Encounter for gynecological examination (general) (routine) without abnormal findings: Secondary | ICD-10-CM | POA: Diagnosis not present

## 2024-03-15 DIAGNOSIS — Z1331 Encounter for screening for depression: Secondary | ICD-10-CM | POA: Diagnosis not present

## 2024-03-15 NOTE — Patient Instructions (Signed)

## 2024-06-05 ENCOUNTER — Other Ambulatory Visit: Payer: Self-pay

## 2024-06-05 ENCOUNTER — Emergency Department (HOSPITAL_BASED_OUTPATIENT_CLINIC_OR_DEPARTMENT_OTHER)

## 2024-06-05 ENCOUNTER — Emergency Department (HOSPITAL_BASED_OUTPATIENT_CLINIC_OR_DEPARTMENT_OTHER): Admitting: Radiology

## 2024-06-05 ENCOUNTER — Inpatient Hospital Stay (HOSPITAL_BASED_OUTPATIENT_CLINIC_OR_DEPARTMENT_OTHER)
Admission: EM | Admit: 2024-06-05 | Discharge: 2024-06-08 | DRG: 321 | Disposition: A | Discharge status: Home / Self Care | Attending: Cardiology | Admitting: Cardiology

## 2024-06-05 ENCOUNTER — Encounter (HOSPITAL_COMMUNITY)
Admission: EM | Disposition: A | Discharge status: Home / Self Care | Payer: Self-pay | Source: Home / Self Care | Attending: Cardiology

## 2024-06-05 DIAGNOSIS — I255 Ischemic cardiomyopathy: Secondary | ICD-10-CM | POA: Diagnosis not present

## 2024-06-05 DIAGNOSIS — I472 Ventricular tachycardia, unspecified: Secondary | ICD-10-CM | POA: Diagnosis not present

## 2024-06-05 DIAGNOSIS — I213 ST elevation (STEMI) myocardial infarction of unspecified site: Secondary | ICD-10-CM | POA: Diagnosis not present

## 2024-06-05 DIAGNOSIS — Z8249 Family history of ischemic heart disease and other diseases of the circulatory system: Secondary | ICD-10-CM | POA: Diagnosis not present

## 2024-06-05 DIAGNOSIS — I2102 ST elevation (STEMI) myocardial infarction involving left anterior descending coronary artery: Secondary | ICD-10-CM

## 2024-06-05 DIAGNOSIS — I2510 Atherosclerotic heart disease of native coronary artery without angina pectoris: Secondary | ICD-10-CM

## 2024-06-05 DIAGNOSIS — I5021 Acute systolic (congestive) heart failure: Secondary | ICD-10-CM | POA: Diagnosis present

## 2024-06-05 DIAGNOSIS — R0789 Other chest pain: Secondary | ICD-10-CM | POA: Diagnosis not present

## 2024-06-05 DIAGNOSIS — I2109 ST elevation (STEMI) myocardial infarction involving other coronary artery of anterior wall: Principal | ICD-10-CM

## 2024-06-05 DIAGNOSIS — E785 Hyperlipidemia, unspecified: Secondary | ICD-10-CM | POA: Insufficient documentation

## 2024-06-05 DIAGNOSIS — Z79899 Other long term (current) drug therapy: Secondary | ICD-10-CM | POA: Diagnosis not present

## 2024-06-05 DIAGNOSIS — E7800 Pure hypercholesterolemia, unspecified: Secondary | ICD-10-CM

## 2024-06-05 DIAGNOSIS — Z955 Presence of coronary angioplasty implant and graft: Secondary | ICD-10-CM

## 2024-06-05 DIAGNOSIS — I5020 Unspecified systolic (congestive) heart failure: Secondary | ICD-10-CM | POA: Insufficient documentation

## 2024-06-05 HISTORY — PX: LEFT HEART CATH AND CORONARY ANGIOGRAPHY: CATH118249

## 2024-06-05 HISTORY — PX: CORONARY/GRAFT ACUTE MI REVASCULARIZATION: CATH118305

## 2024-06-05 LAB — LIPID PANEL
Cholesterol: 225 mg/dL — ABNORMAL HIGH (ref 0–200)
HDL: 47 mg/dL (ref >40)
LDL Cholesterol: 133 mg/dL — ABNORMAL HIGH (ref 0–99)
Total CHOL/HDL Ratio: 4.8 ratio
Triglycerides: 225 mg/dL — ABNORMAL HIGH (ref <150)
VLDL: 45 mg/dL — ABNORMAL HIGH (ref 0–40)

## 2024-06-05 LAB — APTT: aPTT: 30 s (ref 24–36)

## 2024-06-05 LAB — BASIC METABOLIC PANEL WITH GFR
Anion gap: 15 (ref 5–15)
BUN: 19 mg/dL (ref 6–20)
CO2: 19 mmol/L — ABNORMAL LOW (ref 22–32)
Calcium: 9.2 mg/dL (ref 8.9–10.3)
Chloride: 99 mmol/L (ref 98–111)
Creatinine, Ser: 0.96 mg/dL (ref 0.44–1.00)
GFR, Estimated: >60 mL/min (ref >60)
Glucose, Bld: 157 mg/dL — ABNORMAL HIGH (ref 70–99)
Potassium: 3.9 mmol/L (ref 3.5–5.1)
Sodium: 133 mmol/L — ABNORMAL LOW (ref 135–145)

## 2024-06-05 LAB — CBC
HCT: 44.9 % (ref 36.0–46.0)
Hemoglobin: 14.7 g/dL (ref 12.0–15.0)
MCH: 29.8 pg (ref 26.0–34.0)
MCHC: 32.7 g/dL (ref 30.0–36.0)
MCV: 90.9 fL (ref 80.0–100.0)
Platelets: 255 K/uL (ref 150–400)
RBC: 4.94 MIL/uL (ref 3.87–5.11)
RDW: 12.4 % (ref 11.5–15.5)
WBC: 13.4 K/uL — ABNORMAL HIGH (ref 4.0–10.5)
nRBC: 0 % (ref 0.0–0.2)

## 2024-06-05 LAB — POCT ACTIVATED CLOTTING TIME: Activated Clotting Time: 308 s

## 2024-06-05 LAB — PROTIME-INR
INR: 0.9 (ref 0.8–1.2)
Prothrombin Time: 13.1 s (ref 11.4–15.2)

## 2024-06-05 LAB — HEMOGLOBIN A1C
Hgb A1c MFr Bld: 5.2 % (ref 4.8–5.6)
Mean Plasma Glucose: 102.54 mg/dL

## 2024-06-05 LAB — LACTIC ACID, PLASMA: Lactic Acid, Venous: 2.5 mmol/L (ref 0.5–1.9)

## 2024-06-05 LAB — TROPONIN T, HIGH SENSITIVITY: Troponin T High Sensitivity: 114 ng/L (ref <19)

## 2024-06-05 MED ORDER — MIDAZOLAM HCL 2 MG/2ML IJ SOLN
INTRAMUSCULAR | Status: DC | PRN
  Administered 2024-06-05: 1 mg via INTRAVENOUS

## 2024-06-05 MED ORDER — HYDRALAZINE HCL 20 MG/ML IJ SOLN: 5.0000 mg | INTRAMUSCULAR | Status: AC | PRN

## 2024-06-05 MED ORDER — ASPIRIN 81 MG PO CHEW
81.0000 mg | CHEWABLE_TABLET | Freq: Every day | ORAL | Status: DC
  Administered 2024-06-06 – 2024-06-08 (×3): 81 mg via ORAL
  Filled 2024-06-05 (×3): qty 1

## 2024-06-05 MED ORDER — ONDANSETRON HCL 4 MG/2ML IJ SOLN: 4.0000 mg | Freq: Four times a day (QID) | INTRAMUSCULAR | Status: DC | PRN

## 2024-06-05 MED ORDER — ATORVASTATIN CALCIUM 80 MG PO TABS
80.0000 mg | ORAL_TABLET | Freq: Every day | ORAL | Status: DC
  Administered 2024-06-06 – 2024-06-08 (×4): 80 mg via ORAL
  Filled 2024-06-05 (×3): qty 1

## 2024-06-05 MED ORDER — CHLORHEXIDINE GLUCONATE CLOTH 2 % EX PADS
6.0000 | MEDICATED_PAD | Freq: Every day | CUTANEOUS | Status: DC
  Administered 2024-06-06 – 2024-06-07 (×2): 6 via TOPICAL

## 2024-06-05 MED ORDER — LABETALOL HCL 5 MG/ML IV SOLN
10.0000 mg | INTRAVENOUS | Status: AC | PRN
Start: 2024-06-05 — End: 2024-06-06

## 2024-06-05 MED ORDER — TICAGRELOR 90 MG PO TABS
90.0000 mg | ORAL_TABLET | Freq: Two times a day (BID) | ORAL | Status: DC
  Administered 2024-06-06 – 2024-06-08 (×5): 90 mg via ORAL
  Filled 2024-06-05 (×5): qty 1

## 2024-06-05 MED ORDER — MIDAZOLAM HCL 2 MG/2ML IJ SOLN
INTRAMUSCULAR | Status: AC
  Filled 2024-06-05: qty 2

## 2024-06-05 MED ORDER — HEPARIN SODIUM (PORCINE) 1000 UNIT/ML IJ SOLN
INTRAMUSCULAR | Status: AC
  Filled 2024-06-05: qty 10

## 2024-06-05 MED ORDER — ONDANSETRON HCL 4 MG/2ML IJ SOLN
INTRAMUSCULAR | Status: DC | PRN
  Administered 2024-06-05: 4 mg via INTRAVENOUS

## 2024-06-05 MED ORDER — VERAPAMIL HCL 2.5 MG/ML IV SOLN
INTRAVENOUS | Status: AC
  Filled 2024-06-05: qty 2

## 2024-06-05 MED ORDER — NITROGLYCERIN 0.4 MG SL SUBL
0.4000 mg | SUBLINGUAL_TABLET | SUBLINGUAL | Status: DC | PRN
  Administered 2024-06-05 (×3): 0.4 mg via SUBLINGUAL
  Filled 2024-06-05 (×2): qty 1

## 2024-06-05 MED ORDER — HEPARIN SODIUM (PORCINE) 1000 UNIT/ML IJ SOLN
INTRAMUSCULAR | Status: DC | PRN
Start: 2024-06-05 — End: 2024-06-05
  Administered 2024-06-05: 6000 [IU] via INTRAVENOUS

## 2024-06-05 MED ORDER — FENTANYL CITRATE (PF) 100 MCG/2ML IJ SOLN
INTRAMUSCULAR | Status: DC | PRN
  Administered 2024-06-05: 25 ug via INTRAVENOUS

## 2024-06-05 MED ORDER — SODIUM CHLORIDE 0.9% FLUSH: 3.0000 mL | INTRAVENOUS | Status: DC | PRN

## 2024-06-05 MED ORDER — TICAGRELOR 90 MG PO TABS
ORAL_TABLET | ORAL | Status: AC
  Filled 2024-06-05: qty 2

## 2024-06-05 MED ORDER — ASPIRIN 81 MG PO CHEW
324.0000 mg | CHEWABLE_TABLET | Freq: Once | ORAL | Status: DC
  Filled 2024-06-05: qty 4

## 2024-06-05 MED ORDER — IOHEXOL 350 MG/ML SOLN
INTRAVENOUS | Status: DC | PRN
  Administered 2024-06-05: 110 mL via INTRA_ARTERIAL

## 2024-06-05 MED ORDER — LOSARTAN POTASSIUM 25 MG PO TABS
12.5000 mg | ORAL_TABLET | Freq: Every day | ORAL | Status: DC
  Administered 2024-06-06 – 2024-06-08 (×3): 12.5 mg via ORAL
  Filled 2024-06-05 (×3): qty 1

## 2024-06-05 MED ORDER — METOPROLOL SUCCINATE ER 25 MG PO TB24: 12.5000 mg | ORAL_TABLET | Freq: Every day | ORAL | Status: DC

## 2024-06-05 MED ORDER — SODIUM CHLORIDE 0.9 % IV SOLN: 250.0000 mL | INTRAVENOUS | Status: AC | PRN

## 2024-06-05 MED ORDER — FENTANYL CITRATE (PF) 100 MCG/2ML IJ SOLN
INTRAMUSCULAR | Status: AC
  Filled 2024-06-05: qty 2

## 2024-06-05 MED ORDER — SODIUM CHLORIDE 0.9 % IV SOLN: INTRAVENOUS | Status: AC

## 2024-06-05 MED ORDER — VERAPAMIL HCL 2.5 MG/ML IV SOLN
INTRAVENOUS | Status: DC | PRN
  Administered 2024-06-05: 10 mL via INTRA_ARTERIAL

## 2024-06-05 MED ORDER — NITROGLYCERIN 1 MG/10 ML FOR IR/CATH LAB
INTRA_ARTERIAL | Status: DC | PRN
  Administered 2024-06-05: 200 ug via INTRACORONARY

## 2024-06-05 MED ORDER — LIDOCAINE HCL (PF) 1 % IJ SOLN
INTRAMUSCULAR | Status: DC | PRN
  Administered 2024-06-05: 2 mL

## 2024-06-05 MED ORDER — TICAGRELOR 90 MG PO TABS
ORAL_TABLET | ORAL | Status: DC | PRN
  Administered 2024-06-05: 180 mg via ORAL

## 2024-06-05 MED ORDER — ONDANSETRON HCL 4 MG/2ML IJ SOLN
INTRAMUSCULAR | Status: AC
  Filled 2024-06-05: qty 2

## 2024-06-05 MED ORDER — HEPARIN (PORCINE) 25000 UT/250ML-% IV SOLN
900.0000 [IU]/h | INTRAVENOUS | Status: DC
  Administered 2024-06-05: 900 [IU]/h via INTRAVENOUS
  Filled 2024-06-05: qty 250

## 2024-06-05 MED ORDER — LIDOCAINE HCL (PF) 1 % IJ SOLN
INTRAMUSCULAR | Status: AC
  Filled 2024-06-05: qty 30

## 2024-06-05 MED ORDER — ACETAMINOPHEN 325 MG PO TABS
650.0000 mg | ORAL_TABLET | ORAL | Status: DC | PRN
  Administered 2024-06-06: 650 mg via ORAL
  Filled 2024-06-05: qty 2

## 2024-06-05 MED ORDER — HEPARIN SODIUM (PORCINE) 5000 UNIT/ML IJ SOLN
4000.0000 [IU] | Freq: Once | INTRAMUSCULAR | Status: AC
  Administered 2024-06-05: 4000 [IU] via INTRAVENOUS
  Filled 2024-06-05: qty 1

## 2024-06-05 MED ORDER — NITROGLYCERIN 1 MG/10 ML FOR IR/CATH LAB
INTRA_ARTERIAL | Status: AC
  Filled 2024-06-05: qty 10

## 2024-06-05 NOTE — H&P (Signed)
 CARDIOLOGY ADMIT NOTE     Patient ID: Maria Zamora MRN: [id number] DOB/AGE: [birth year] 63 y.o.  Admit date: 06/05/2024.   Primary Physician:  Rolinda Millman, MD  Patient ID: Maria Zamora, female    DOB: [birth year], 63 y.o.   MRN: [id number]  Chief Complaint  Patient presents with   Code STEMI   HPI:    Maria Zamora  is a 63 y.o. Caucasian female patient with hypercholesterolemia but otherwise no known medical history, fairly active, started having chest discomfort and back pain and neck pain around 4 PM this afternoon while she was mowing the grass.  Symptoms persisted, she eventually presented to [facility] emergency department with ongoing chest discomfort where EKG confirmed STEMI, I reviewed the EKG as well and activated the STEMI.  Patient will be taken directly to the cardiac catheterization lab.  I have reviewed her labs including BMP, CBC.  Past Medical History:  Diagnosis Date   Hyperlipidemia    Past Surgical History:  Procedure Laterality Date   PERCUTANEOUS FIXATION TIBIAL SHAFT FRACTURE W/ PINS / SCREWS Left 01/17/2023   Left middle finger   RETINAL DETACHMENT SURGERY  11/22/2009   TONSILLECTOMY AND ADENOIDECTOMY     Social History   Tobacco Use   Smoking status: Never   Smokeless tobacco: Never  Substance Use Topics   Alcohol use: Yes    Alcohol/week: 5.0 - 7.0 standard drinks of alcohol    Types: 5 - 7 Standard drinks or equivalent per week   Family History  Problem Relation Age of Onset   Heart disease Paternal Grandmother    Heart disease Paternal Grandfather    Hypertension Father    Cancer Maternal Uncle        prostate   Cancer Maternal Grandfather        prostate    ROS  Review of Systems  Cardiovascular:  Positive for chest pain. Negative for dyspnea on exertion and leg swelling.  Gastrointestinal:  Positive for nausea.   Objective      06/05/2024    6:01 PM 06/05/2024    6:00 PM 06/05/2024    5:41 PM  Vitals with BMI   Height   5' 4  Weight   155 lbs  BMI   26.59  Systolic  160   Diastolic  102   Pulse 102 100       Physical Exam Neck:     Vascular: No carotid bruit or JVD.  Cardiovascular:     Rate and Rhythm: Normal rate and regular rhythm.     Pulses: Intact distal pulses.     Heart sounds: No murmur heard.    Gallop present. S4 sounds present.  Pulmonary:     Effort: Pulmonary effort is normal.     Breath sounds: Normal breath sounds.  Abdominal:     General: Bowel sounds are normal.     Palpations: Abdomen is soft.  Musculoskeletal:     Right lower leg: No edema.     Left lower leg: No edema.    Laboratory examination:   Recent Labs    06/05/24 1754  NA 133*  K 3.9  CL 99  CO2 19*  GLUCOSE 157*  BUN 19  CREATININE 0.96  CALCIUM  9.2  GFRNONAA >60   estimated creatinine clearance is 59.9 mL/min (by C-G formula based on SCr of 0.96 mg/dL).     Latest Ref Rng & Units 06/05/2024    5:54 PM 06/13/2015   11:34 AM  CMP  Glucose 70 - 99 mg/dL 842  87   BUN 6 - 20 mg/dL 19  11   Creatinine 9.55 - 1.00 mg/dL 9.03  9.22   Sodium 864 - 145 mmol/L 133  135   Potassium 3.5 - 5.1 mmol/L 3.9  4.7   Chloride 98 - 111 mmol/L 99  98   CO2 22 - 32 mmol/L 19  22   Calcium  8.9 - 10.3 mg/dL 9.2  9.8   Total Protein 6.0 - 8.3 g/dL  7.1   Total Bilirubin 0.2 - 1.2 mg/dL  0.6   Alkaline Phos 39 - 117 U/L  92   AST 0 - 37 U/L  23   ALT 0 - 35 U/L  21       Latest Ref Rng & Units 06/05/2024    5:54 PM 06/16/2016    2:00 PM 06/13/2015   11:34 AM  CBC  WBC 4.0 - 10.5 K/uL 13.4  8.4  9.4   Hemoglobin 12.0 - 15.0 g/dL 85.2  85.1  85.5   Hematocrit 36.0 - 46.0 % 44.9  45.0  43.4   Platelets 150 - 400 K/uL 255  293  315    Lipid Panel     Component Value Date/Time   CHOL 210 (H) 06/16/2016 1400   TRIG 133 06/16/2016 1400   HDL 73 06/16/2016 1400   CHOLHDL 2.9 06/16/2016 1400   VLDL 27 06/16/2016 1400   LDLCALC 110 06/16/2016 1400   HEMOGLOBIN A1C Lab Results  Component Value  Date   HGBA1C 5.2 05/15/2013   MPG 103 05/15/2013   TSH No results for input(s): TSH in the last 8760 hours.  Cardiac Panel (last 3 results) No results for input(s): CKTOTAL, CKMB, TROPONINIHS, RELINDX in the last 72 hours.   Medications and allergies  No Known Allergies    sodium chloride  10 mL/hr at 06/05/24 1757   heparin  900 Units/hr (06/05/24 1813)   Current Outpatient Medications  Medication Instructions   Fish Oil 2,000 mg, Daily   Multiple Vitamins-Minerals (MULTIVITAMIN PO) Daily    Radiology:  [facility] Chest Port 1 View Result Date: 06/05/2024 CLINICAL DATA:  STEMI. EXAM: PORTABLE CHEST 1 VIEW COMPARISON:  None Available. FINDINGS: The heart size and mediastinal contours are within normal limits. Both lungs are clear. The visualized skeletal structures are unremarkable. IMPRESSION: No active disease. Electronically Signed   By: Lynwood Landy Raddle M.D.   On: 06/05/2024 18:05    Cardiac Studies:   EKG 06/05/2024: Extensive anterolateral ST elevation suggestive anterolateral STEMI.  Assessment   1.  Extensive anterolateral STEMI 2.  Hypercholesterolemia  Recommendations:   Patient emergently taken to the cardiac catheterization lab and found to have an occluded proximal LAD for which he underwent stenting.  She will be managed aggressively with risk factor modification.    Gordy Bergamo, MD, [facility] 06/05/2024, 7:41 PM [facility] [address] [place], [place] [address] Phone: [phone]. Fax:  [phone]

## 2024-06-05 NOTE — Discharge Instructions (Signed)
 You are being transferred to [facility] for Emergent Cardiology consultation  Information about your medication: Brilinta  (anti-platelet agent)  Generic Name (Brand): ticagrelor  (Brilinta ), twice daily medication  PURPOSE: You are taking this medication along with aspirin  to lower your chance of having a heart attack, stroke, or blood clots in your heart stent. These can be fatal. Brilinta  and aspirin  help prevent platelets from sticking together and forming a clot that can block an artery or your stent.   Common SIDE EFFECTS you may experience include: bruising or bleeding more easily, shortness of breath  Do not stop taking BRILINTA  without talking to the doctor who prescribes it for you. People who are treated with a stent and stop taking Brilinta  too soon, have a higher risk of getting a blood clot in the stent, having a heart attack, or dying. If you stop Brilinta  because of bleeding, or for other reasons, your risk of a heart attack or stroke may increase.   Avoid taking NSAID agents or anti-inflammatory medications such as ibuprofen, naproxen given increased bleed risk with Brilinta  - can use acetaminophen  (Tylenol ) if needed for pain.  Tell all of your doctors and dentists that you are taking Brilinta . They should talk to the doctor who prescribed Brilinta  for you before you have any surgery or invasive procedure.   Contact your health care provider if you experience: severe or uncontrollable bleeding, pink/red/brown urine, vomiting blood or vomit that looks like coffee grounds, red or black stools (looks like tar), coughing up blood or blood clots ----------------------------------------------------------------------------------------------------------------------

## 2024-06-05 NOTE — ED Notes (Signed)
 [facility] at bedside to transport pt to [facility] cath lab

## 2024-06-05 NOTE — ED Provider Notes (Signed)
 [name] EMERGENCY DEPARTMENT AT [facility] Provider Note   CSN: [id number] Arrival date & time: 06/05/24  1737     Patient presents with: Code STEMI   Maria Zamora is a 63 y.o. female.   HPI   63 year old female with medical history significant for HLD presenting to the emergency department with a chief complaint of chest tightness.  The patient states that 1 week ago she was mowing her grass and had a little bit of chest tightness.  Today acutely at 4 PM she was mowing the grass when she developed acute onset central left-sided chest pain with associated neck and jaw pain.  She felt diaphoresis as well as nausea.  She denies any shortness of breath.  Pain is not ripping or tearing.  She continues to endorse chest discomfort.  On arrival, initial EKG showed an anterolateral STEMI with ST elevations in leads I and aVL with additional anterior elevations in leads V2, V3 and V4.  Prior to Admission medications   Medication Sig Start Date End Date Taking? Authorizing Provider  Multiple Vitamins-Minerals (MULTIVITAMIN PO) Take by mouth daily.    [provider]  Omega-3 Fatty Acids (FISH OIL) 1000 MG CAPS Take 2,000 mg by mouth daily.     [provider]    Allergies: Patient has no known allergies.    Review of Systems  Unable to perform ROS: Acuity of condition    Updated Vital Signs BP (!) 160/102   Pulse (!) 102   Temp 98 F (36.7 C)   Resp 17   Ht 5' 4 (1.626 m)   Wt 70.3 kg   LMP 11/18/2015 (Exact Date)   SpO2 95%   BMI 26.61 kg/m   Physical Exam Vitals and nursing note reviewed.  Constitutional:      General: She is not in acute distress.    Appearance: She is well-developed. She is ill-appearing and diaphoretic.  HENT:     Head: Normocephalic and atraumatic.  Eyes:     Conjunctiva/sclera: Conjunctivae normal.  Cardiovascular:     Rate and Rhythm: Regular rhythm. Tachycardia present.     Pulses: Normal pulses.     Heart  sounds: No murmur heard. Pulmonary:     Effort: Pulmonary effort is normal. No respiratory distress.     Breath sounds: Normal breath sounds.  Abdominal:     Palpations: Abdomen is soft.     Tenderness: There is no abdominal tenderness.  Musculoskeletal:        General: No swelling.     Cervical back: Neck supple.  Skin:    General: Skin is warm.     Capillary Refill: Capillary refill takes less than 2 seconds.  Neurological:     Mental Status: She is alert.  Psychiatric:        Mood and Affect: Mood normal.     (all labs ordered are listed, but only abnormal results are displayed) Labs Reviewed  CBC - Abnormal; Notable for the following components:      Result Value   WBC 13.4 (*)    All other components within normal limits  PROTIME-INR  APTT  BASIC METABOLIC PANEL WITH GFR  HEMOGLOBIN A1C  LIPID PANEL  LACTIC ACID, PLASMA  TROPONIN T, HIGH SENSITIVITY    EKG: EKG Interpretation Date/Time:  Tuesday June 05 2024 17:43:18 EDT Ventricular Rate:  104 PR Interval:  140 QRS Duration:  98 QT Interval:  374 QTC Calculation: 491 R Axis:   -30  Text  Interpretation: STEMI Sinus tachycardia Possible Left atrial enlargement Left axis deviation Incomplete right bundle branch block Left ventricular hypertrophy ( R in aVL , Cornell product ) Anteroseptal infarct , possibly acute Lateral injury pattern Abnormal ECG No previous ECGs available Reconfirmed by Jerrol Agent ([id number]) on 06/05/2024 6:37:19 PM  Radiology: [facility] Chest Port 1 View Result Date: 06/05/2024 CLINICAL DATA:  STEMI. EXAM: PORTABLE CHEST 1 VIEW COMPARISON:  None Available. FINDINGS: The heart size and mediastinal contours are within normal limits. Both lungs are clear. The visualized skeletal structures are unremarkable. IMPRESSION: No active disease. Electronically Signed   By: Agent Landy Raddle M.D.   On: 06/05/2024 18:05     .Critical Care  Performed by: Jerrol Agent, MD Authorized by: Jerrol Agent, MD    Critical care provider statement:    Critical care time (minutes):  30   Critical care was necessary to treat or prevent imminent or life-threatening deterioration of the following conditions:  Cardiac failure   Critical care was time spent personally by me on the following activities:  Development of treatment plan with patient or surrogate, discussions with consultants, evaluation of patient's response to treatment, examination of patient, ordering and review of laboratory studies, ordering and review of radiographic studies, ordering and performing treatments and interventions, pulse oximetry, re-evaluation of patient's condition and review of old charts   Care discussed with: accepting provider at another facility      Medications Ordered in the ED  0.9 %  sodium chloride  infusion ( Intravenous New Bag/Given 06/05/24 1757)  aspirin  chewable tablet 324 mg (324 mg Oral Not Given 06/05/24 1758)  nitroGLYCERIN  (NITROSTAT ) SL tablet 0.4 mg (0.4 mg Sublingual Given 06/05/24 1810)  heparin  ADULT infusion 100 units/mL (25000 units/250mL) (900 Units/hr Intravenous New Bag/Given 06/05/24 1813)  heparin  injection 4,000 Units (4,000 Units Intravenous Given 06/05/24 1758)                                    Medical Decision Making Amount and/or Complexity of Data Reviewed Labs: ordered. Radiology: ordered.  Risk OTC drugs. Prescription drug management.    63 year old female with medical history significant for HLD presenting to the emergency department with a chief complaint of chest tightness.  The patient states that 1 week ago she was mowing her grass and had a little bit of chest tightness.  Today acutely at 4 PM she was mowing the grass when she developed acute onset central left-sided chest pain with associated neck and jaw pain.  She felt diaphoresis as well as nausea.  She denies any shortness of breath.  Pain is not ripping or tearing.  She continues to endorse chest discomfort.  On arrival,  initial EKG showed an anterolateral STEMI with ST elevations in leads I and aVL with additional anterior elevations in leads V2, V3 and V4.  On arrival, the patient was afebrile, heart rate in the low 100s, not tachypneic, BP 160/102, saturating 94% on room air.  Initial EKG revealed an anterior lateral STEMI, code STEMI was called.  Patient states that she took 2 full-strength aspirin  at home prior to arrival.  Patient was administered heparin  4000 units.  Cardiology was consulted, Dr. Ladona reviewed the patient's EKG and agreed for emergent transfer to the cardiac Cath Lab.  Laboratory evaluation obtained, x-ray imaging revealed no widening of the mediastinum.  Patient denies any ripping or tearing chest pain to suggest dissection, concern for LAD  occlusion.   Contacted for transfer, patient administered nitroglycerin  sublingual for pain control, transferred to [facility] in critical but stable condition.     Final diagnoses:  ST elevation myocardial infarction (STEMI), unspecified artery [facility])    ED Discharge Orders     None          Jerrol Agent, MD 06/05/24 [phone]

## 2024-06-05 NOTE — Progress Notes (Signed)
 Chaplain responded to Code Stemi, finding pt's husband in the 2H waiting area.  We spoke briefly about his wife's condition.  He was nervous about what was going and when he could see.  Chaplain explained how time in the cath lab folds into a stay in [facility] ICU. Chaplain advised Stemi Tech the pt's husband was in waiting area and shared his phone number.    Chaplain will check back later to check in on pt and husband.  Rock [place] Chaplain

## 2024-06-05 NOTE — Progress Notes (Signed)
 PHARMACY - ANTICOAGULATION CONSULT NOTE  Pharmacy Consult for heparin  Indication: chest pain/ACS  No Known Allergies  Patient Measurements: Height: 5' 4 (162.6 cm) Weight: 70.3 kg (155 lb) IBW/kg (Calculated) : 54.7 HEPARIN  DW (KG): 69  Vital Signs: Temp: 98 F (36.7 C) (07/15 1800) BP: 160/102 (07/15 1800) Pulse Rate: 102 (07/15 1801)  Labs: Recent Labs    06/05/24 1754  HGB 14.7  HCT 44.9  PLT 255    CrCl cannot be calculated (Patient's most recent lab result is older than the maximum 21 days allowed.).   Medical History: Past Medical History:  Diagnosis Date   Hyperlipidemia     Assessment: 72 YOF as code STEMI, she is not on anticoagulation PTA, CBC wnl  Goal of Therapy:  Heparin  level 0.3-0.7 units/ml Monitor platelets by anticoagulation protocol: Yes   Plan:  Heparin  4000 units IV x 1, and gtt at 900 units/hr F/u post-cath plan  Dorn Poot, PharmD, [facility] Clinical Pharmacist ED Pharmacist Phone # [phone] 06/05/2024 6:07 PM

## 2024-06-05 NOTE — ED Triage Notes (Addendum)
 Pt POV reporting central chest, neck, and jaw pain, began after mowing lawn, states she possible overheated, pain slightly improved after taking a break. Denies SOB. 650 mg ASA taken PTA

## 2024-06-06 ENCOUNTER — Other Ambulatory Visit (HOSPITAL_COMMUNITY): Payer: Self-pay

## 2024-06-06 ENCOUNTER — Encounter (HOSPITAL_COMMUNITY): Payer: Self-pay | Admitting: Cardiology

## 2024-06-06 ENCOUNTER — Inpatient Hospital Stay (HOSPITAL_COMMUNITY)

## 2024-06-06 ENCOUNTER — Telehealth (HOSPITAL_COMMUNITY): Payer: Self-pay | Admitting: Pharmacy Technician

## 2024-06-06 DIAGNOSIS — I2109 ST elevation (STEMI) myocardial infarction involving other coronary artery of anterior wall: Secondary | ICD-10-CM | POA: Diagnosis not present

## 2024-06-06 LAB — ECHOCARDIOGRAM COMPLETE
Area-P 1/2: 4.39 cm2
Height: 64 in
S' Lateral: 3.4 cm
Weight: 2342.17 [oz_av]

## 2024-06-06 LAB — LACTIC ACID, PLASMA: Lactic Acid, Venous: 1.6 mmol/L (ref 0.5–1.9)

## 2024-06-06 LAB — BASIC METABOLIC PANEL WITH GFR
Anion gap: 9 (ref 5–15)
BUN: 13 mg/dL (ref 6–20)
CO2: 23 mmol/L (ref 22–32)
Calcium: 8.7 mg/dL — ABNORMAL LOW (ref 8.9–10.3)
Chloride: 101 mmol/L (ref 98–111)
Creatinine, Ser: 0.72 mg/dL (ref 0.44–1.00)
GFR, Estimated: >60 mL/min (ref >60)
Glucose, Bld: 137 mg/dL — ABNORMAL HIGH (ref 70–99)
Potassium: 4.1 mmol/L (ref 3.5–5.1)
Sodium: 133 mmol/L — ABNORMAL LOW (ref 135–145)

## 2024-06-06 LAB — CBC
HCT: 41.6 % (ref 36.0–46.0)
Hemoglobin: 13.8 g/dL (ref 12.0–15.0)
MCH: 29.7 pg (ref 26.0–34.0)
MCHC: 33.2 g/dL (ref 30.0–36.0)
MCV: 89.7 fL (ref 80.0–100.0)
Platelets: 265 K/uL (ref 150–400)
RBC: 4.64 MIL/uL (ref 3.87–5.11)
RDW: 12.2 % (ref 11.5–15.5)
WBC: 15.5 K/uL — ABNORMAL HIGH (ref 4.0–10.5)
nRBC: 0 % (ref 0.0–0.2)

## 2024-06-06 LAB — MAGNESIUM: Magnesium: 2.1 mg/dL (ref 1.7–2.4)

## 2024-06-06 LAB — MRSA NEXT GEN BY PCR, NASAL: MRSA by PCR Next Gen: NOT DETECTED

## 2024-06-06 MED ORDER — DAPAGLIFLOZIN PROPANEDIOL 10 MG PO TABS
10.0000 mg | ORAL_TABLET | Freq: Every day | ORAL | Status: DC
  Administered 2024-06-06 – 2024-06-08 (×3): 10 mg via ORAL
  Filled 2024-06-06 (×3): qty 1

## 2024-06-06 MED ORDER — METOPROLOL SUCCINATE ER 25 MG PO TB24
25.0000 mg | ORAL_TABLET | Freq: Every day | ORAL | Status: DC
  Administered 2024-06-06 – 2024-06-08 (×3): 25 mg via ORAL
  Filled 2024-06-06 (×3): qty 1

## 2024-06-06 MED ORDER — PERFLUTREN LIPID MICROSPHERE
1.0000 mL | INTRAVENOUS | Status: AC | PRN
  Administered 2024-06-06: 3 mL via INTRAVENOUS

## 2024-06-06 MED ORDER — ORAL CARE MOUTH RINSE: 15.0000 mL | OROMUCOSAL | Status: DC | PRN

## 2024-06-06 MED ORDER — ENOXAPARIN SODIUM 40 MG/0.4ML IJ SOSY
40.0000 mg | PREFILLED_SYRINGE | INTRAMUSCULAR | Status: DC
  Administered 2024-06-06 – 2024-06-07 (×2): 40 mg via SUBCUTANEOUS
  Filled 2024-06-06 (×3): qty 0.4

## 2024-06-06 NOTE — Telephone Encounter (Addendum)
 Patient Product/process development scientist completed.    The patient is insured through [facility]. Patient has [employer], may use a copay card, and/or apply for patient assistance if available.    Ran test claim for ticagrelor  (Brilinta ) 90 mg and the current 30 day co-pay is $0.00.  Ran test claim for Farxiga  10 mg and the current 30 day co-pay is $588.24 due to a $35.00 deductible  Ran test claim for Jardiance 10 mg and the current 30 day co-pay is $617.34 due to a $35.00 deductible  This test claim was processed through [employer]- copay amounts may vary at other pharmacies due to [employer], or as the patient moves through the different stages of their insurance plan.     Reyes Sharps, CPHT Pharmacy Technician III Certified Patient Advocate [facility] Patient Advocate Team Direct Number: [phone]  Fax: [phone]

## 2024-06-06 NOTE — Progress Notes (Signed)
 CARDIAC REHAB PHASE I   PRE:  Rate/Rhythm: 90 SR with PVC    BP: sitting 104/67    SpO2: 94 RA  MODE:  Ambulation: 370 ft   POST:  Rate/Rhythm: 117 ST    BP: sitting 122/85     SpO2: 98 RA   Pt tolerated well, no c/o. She is quite active at baseline. To recliner.   Discussed with pt and husband MI, stent, Brilinta  importance, restrictions, diet, exercise guidelines, NTG, and CRPII. Also discussed heart failure management and daily wts.  Gave HF booklet. Mentioned lifevest and gave video to view. Pt  and husband are receptive with appropriate questions.  They are understandably overwhelmed. Will refer to [facility] CRPII.  [phone]  Aliene Aris BS, ACSM-CEP 06/06/2024 3:27 PM

## 2024-06-06 NOTE — Progress Notes (Signed)
 Rounding Note   Patient Name: Maria Zamora Date of Encounter: 06/06/2024  [facility] Cardiologist: None new- Dr Ladona  Subjective Feels well today. No chest pain. No dyspnea. Was aware of irregular heart beat during the night but this has settled down.  Scheduled Meds:  aspirin   81 mg Oral Daily   atorvastatin   80 mg Oral Daily   Chlorhexidine  Gluconate Cloth  6 each Topical Daily   losartan   12.5 mg Oral Daily   metoprolol  succinate  12.5 mg Oral Daily   ticagrelor   90 mg Oral BID   Continuous Infusions:  sodium chloride  10 mL/hr at 06/05/24 1757   sodium chloride      PRN Meds: sodium chloride , acetaminophen , nitroGLYCERIN , ondansetron  (ZOFRAN ) IV, mouth rinse, sodium chloride  flush   Vital Signs  Vitals:   06/06/24 0445 06/06/24 0500 06/06/24 0600 06/06/24 0700  BP: 112/75 120/79 112/75 112/74  Pulse: 73 85 79 87  Resp: 16 18 17 15   Temp:    98.3 F (36.8 C)  TempSrc:    Axillary  SpO2: 95% 93% 96% 92%  Weight:      Height:        Intake/Output Summary (Last 24 hours) at 06/06/2024 0753 Last data filed at 06/06/2024 0659 Gross per 24 hour  Intake 6.54 ml  Output 775 ml  Net -768.46 ml      06/05/2024   11:15 PM 06/05/2024    5:41 PM 03/15/2024    8:58 AM  Last 3 Weights  Weight (lbs) 146 lb 6.2 oz 155 lb 152 lb  Weight (kg) 66.4 kg 70.308 kg 68.947 kg      Telemetry  - NSR with multiple runs of NSVTPersonally Reviewed  ECG  NSR with Q waves across anterior precordium. Mild residual ST elevation - Personally Reviewed  Physical Exam  GEN: No acute distress.   Neck: No JVD Cardiac: RRR, no murmurs, rubs, or gallops.  Respiratory: Clear to auscultation bilaterally. GI: Soft, nontender, non-distended  MS: No edema; No deformity. Radial site is normal.  Neuro:  Nonfocal  Psych: Normal affect   Labs High Sensitivity Troponin:  No results for input(s): TROPONINIHS in the last 720 hours.   Chemistry Recent Labs  Lab 06/05/24 1754  06/05/24 2341  NA 133* 133*  K 3.9 4.1  CL 99 101  CO2 19* 23  GLUCOSE 157* 137*  BUN 19 13  CREATININE 0.96 0.72  CALCIUM  9.2 8.7*  GFRNONAA >60 >60  ANIONGAP 15 9    Lipids  Recent Labs  Lab 06/05/24 1754  CHOL 225*  TRIG 225*  HDL 47  LDLCALC 133*  CHOLHDL 4.8    Hematology Recent Labs  Lab 06/05/24 1754 06/05/24 2341  WBC 13.4* 15.5*  RBC 4.94 4.64  HGB 14.7 13.8  HCT 44.9 41.6  MCV 90.9 89.7  MCH 29.8 29.7  MCHC 32.7 33.2  RDW 12.4 12.2  PLT 255 265   Thyroid No results for input(s): TSH, FREET4 in the last 168 hours.  BNPNo results for input(s): BNP, PROBNP in the last 168 hours.  DDimer No results for input(s): DDIMER in the last 168 hours.   Radiology  CARDIAC CATHETERIZATION Result Date: 06/05/2024 Images from the original result were not included. Cardiac Catheterization 06/05/24: Hemodynamic data: LV 136, 19, EDP 41 mmHg.  Ao ao 131/86, mean 102 mmHg.  There is no pressure gradient across the aortic valve. Angiographic data: LM: Large-caliber vessel.  Smooth and normal. LCx: It is a moderate caliber  vessel and tortuous.  Proximal to mid CX is mild to moderately diffusely diseased with a very tiny OM1 with ostial 70 to 80% stenosis and an OM 2 about 40 to 50% stenosis which is large and mild disease in OM 3. LAD: It is a large vessel, occluded in the proximal segment.  Gives origin to a large D1 which has secondary branches.  There is mild disease in the proximal and mid LAD. RCA: Very large caliber vessel, gives origin to large PDA and PL branch, smooth and normal. Intervention data: Successful PTCA and stenting of the proximal LAD with implantation of a 3.5 x 16 mm Synergy XD DES deployed at 16 atmospheric pressure, stenosis reduced from 100% to 0% with TIMI 0 to TIMI-3 flow.  Large D1 90% sidebranch occlusion has been balloon angioplasty with 2.0 x 12 mm emerge at 3 atmospheric pressure, less than 10% stenosis with TIMI-3 flow improved from TIMI II  flow. Impression and recommendations: Patient will need aggressive risk modification in view of moderate diffuse disease in the circumflex and also presentation with acute STEMI.  DAPT with aspirin  and Brilinta  for a year.   DG Chest Port 1 View Result Date: 06/05/2024 CLINICAL DATA:  STEMI. EXAM: PORTABLE CHEST 1 VIEW COMPARISON:  None Available. FINDINGS: The heart size and mediastinal contours are within normal limits. Both lungs are clear. The visualized skeletal structures are unremarkable. IMPRESSION: No active disease. Electronically Signed   By: Lynwood Landy Raddle M.D.   On: 06/05/2024 18:05    Cardiac Studies LEFT HEART CATH AND CORONARY ANGIOGRAPHY  Coronary/Graft Acute MI Revascularization   Conclusion  Cardiac Catheterization 06/05/24: Hemodynamic data: LV 136, 19, EDP 41 mmHg.  Ao ao 131/86, mean 102 mmHg.  There is no pressure gradient across the aortic valve.   Angiographic data: LM: Large-caliber vessel.  Smooth and normal. LCx: It is a moderate caliber vessel and tortuous.  Proximal to mid CX is mild to moderately diffusely diseased with a very tiny OM1 with ostial 70 to 80% stenosis and an OM 2 about 40 to 50% stenosis which is large and mild disease in OM 3. LAD: It is a large vessel, occluded in the proximal segment.  Gives origin to a large D1 which has secondary branches.  There is mild disease in the proximal and mid LAD. RCA: Very large caliber vessel, gives origin to large PDA and PL branch, smooth and normal.   Intervention data: Successful PTCA and stenting of the proximal LAD with implantation of a 3.5 x 16 mm Synergy XD DES deployed at 16 atmospheric pressure, stenosis reduced from 100% to 0% with TIMI 0 to TIMI-3 flow.  Large D1 90% sidebranch occlusion has been balloon angioplasty with 2.0 x 12 mm emerge at 3 atmospheric pressure, less than 10% stenosis with TIMI-3 flow improved from TIMI II flow.  Patient Profile   63 y.o. female with HLD presents with acute  anterior STEMI  Assessment & Plan  Acute anterior STEMI. S/p successful PCI of the LAD with DES and rescue POBA of first diagonal. Asymptomatic today. High EDP at cath 41 mm Hg. Does not have rales on exam and CXR clear. Will assess LV function on Echo. Initiate heart failure therapy with ARB and beta blocker. Titrate as tolerated. Consider aldactone. If LV function is poor may need to consider Lifevest at DC. On DAPT for one year Acute systolic CHF. Suspect she has significant LV dysfunction based on presentation, cath and Ecg findings. Will check Echo  NSVT. Likely secondary to reperfusion. Improving but will need to watch in unit today. Electrolytes ok. Titrate beta blocker HLD on high dose statin     For questions or updates, please contact [facility] Please consult [email or web address] for contact info under     Signed, Ponciano Shealy [place], MD  06/06/2024, 7:53 AM

## 2024-06-06 NOTE — Progress Notes (Signed)
   As noted, patient mets criteria for lifevest placement per MD note. Orders placed and rep notified.   SignedManuelita Rummer, NP-C 06/06/2024, 1:14 PM Pager: [phone]

## 2024-06-06 NOTE — Plan of Care (Signed)
  Problem: Education: Goal: Knowledge of General Education information will improve Description: Including pain rating scale, medication(s)/side effects and non-pharmacologic comfort measures Outcome: Progressing   Problem: Nutrition: Goal: Adequate nutrition will be maintained Outcome: Progressing   Problem: Elimination: Goal: Will not experience complications related to urinary retention Outcome: Progressing   Problem: Pain Managment: Goal: General experience of comfort will improve and/or be controlled Outcome: Progressing   Problem: Safety: Goal: Ability to remain free from injury will improve Outcome: Progressing   Problem: Skin Integrity: Goal: Risk for impaired skin integrity will decrease Outcome: Progressing   Problem: Cardiovascular: Goal: Vascular access site(s) Level 0-1 will be maintained Outcome: Progressing   Problem: Health Behavior/Discharge Planning: Goal: Ability to safely manage health-related needs after discharge will improve Outcome: Progressing

## 2024-06-06 NOTE — TOC CM/SW Note (Signed)
 Transition of Care [facility]) - Inpatient Brief Assessment   Patient Details  Name: Maria Zamora MRN: [id number] Date of Birth: February 27, 1961  Transition of Care [facility]) CM/SW Contact:    Sudie Erminio Deems, RN Phone Number: 06/06/2024, 4:10 PM   Clinical Narrative: Patient presented for chest pain- Stemi. PTA patient has support of family. Case Manager received order for Life Vest; clinicals submitted to [facility]. Awaiting insurance verification. Case Manager will continue to follow.    Transition of Care Asessment: Insurance and Status: Insurance coverage has been reviewed Patient has primary care physician: Yes Prior/Current Home Services: No current home services Social Drivers of Health Review: SDOH reviewed no interventions necessary Readmission risk has been reviewed: Yes Transition of care needs: transition of care needs identified, TOC will continue to follow

## 2024-06-06 NOTE — Progress Notes (Addendum)
   Heart Failure Stewardship Pharmacist Progress Note   PCP: Rolinda Millman, MD PCP-Cardiologist: None    HPI:  63 yo F with PMH of HLD.   Presented to the ED on 7/15 with chest, neck, and jaw pain after mowing the lawn. Symptoms associated with diaphoresis and nausea. Denied shortness of breath. Initial EKG with anterolateral STEMI with ST elevations in leads I and aVL and additional anterior elevations in leads V2, V3, and V4. Taken emergently to the cath lab and found to have total occlusion of proxLAD s/p PCI with DES. LVEFP 41. CXR with no active disease. ECHO on 7/16 with LVEF 40-45% (although per MD read - appears more like 30%), RWMA, RV normal, mild MR.   Denies shortness of breath or chest pain. No LE edema on exam. She reports having ankle edema as well as fatigue and some shortness of breath on exertion in the weeks leading up to this admission but associated it more with traveling and the summer temperatures. She still has some palpitations but less frequently and intense than last night. Reviewed GDMT and goals of therapy.   Current HF Medications: Beta Blocker: metoprolol  XL 25 mg daily ACE/ARB/ARNI: losartan  12.5 mg daily SGLT2i: Farxiga  10 mg daily  Prior to admission HF Medications: None  Pertinent Lab Values: Serum creatinine 0.72, BUN 13, Potassium 4.1, Sodium 133, Magnesium 2.1, A1c 5.2   Vital Signs: Weight: 146 lbs  Blood pressure: 100-120/70s  Heart rate: 80-100s  I/O: incomplete  Medication Assistance / Insurance Benefits Check: Does the patient have prescription insurance?  Yes Type of insurance plan: Financial risk analyst  Outpatient Pharmacy:  Prior to admission outpatient pharmacy: [facility] Is the patient willing to use [facility] TOC pharmacy at discharge? Yes Is the patient willing to transition their outpatient pharmacy to utilize a [facility] outpatient pharmacy?   No    Assessment: 1. Acute systolic CHF (LVEF 30%), due to ICM. NYHA class  II symptoms. - LVEDP elevated on cath but does not appear volume overloaded on exam and CXR clear.  - Agree with adding metoprolol  XL 25 mg daily - Continue losartan  12.5 mg daily - Consider adding spironolactone 12.5 mg daily tomorrow if BP stable - Agree with adding Farxiga  10 mg daily  Plan: 1) Medication changes recommended at this time: - Add spironolactone 12.5 mg daily tomorrow if BP stable  2) Patient assistance: - $3500 deductible remaining on insurance - Eligible for monthly copay cards - Farxiga  copay $588.24 until deductible met - copay card covers $150 per month - Farxiga  copay card provided. Instructed to bring this to [facility] - Patient states they can afford the Farxiga  copay with the copay card via [facility] cards. No other assistance needed.   3)  Education  - Patient has been educated on current HF medications and potential additions to HF medication regimen - Patient verbalizes understanding that over the next few months, these medication doses may change and more medications may be added to optimize HF regimen - Patient has been educated on basic disease state pathophysiology and goals of therapy   Duwaine Plant, PharmD, BCPS Heart Failure Stewardship Pharmacist Phone [phone]

## 2024-06-06 NOTE — Progress Notes (Signed)
 I personally reviewed patient's Echo and also reviewed with Dr Francyne. LV function is significantly worse than reported and is 30% not 40-45%.   Adeeb Konecny [place] MD, FACC

## 2024-06-07 ENCOUNTER — Encounter (HOSPITAL_COMMUNITY): Payer: Self-pay | Admitting: Cardiology

## 2024-06-07 LAB — CBC
HCT: 43.1 % (ref 36.0–46.0)
Hemoglobin: 14.2 g/dL (ref 12.0–15.0)
MCH: 29.8 pg (ref 26.0–34.0)
MCHC: 32.9 g/dL (ref 30.0–36.0)
MCV: 90.5 fL (ref 80.0–100.0)
Platelets: 267 K/uL (ref 150–400)
RBC: 4.76 MIL/uL (ref 3.87–5.11)
RDW: 12.4 % (ref 11.5–15.5)
WBC: 15.3 K/uL — ABNORMAL HIGH (ref 4.0–10.5)
nRBC: 0 % (ref 0.0–0.2)

## 2024-06-07 LAB — BASIC METABOLIC PANEL WITH GFR
Anion gap: 10 (ref 5–15)
BUN: 13 mg/dL (ref 6–20)
CO2: 25 mmol/L (ref 22–32)
Calcium: 9.3 mg/dL (ref 8.9–10.3)
Chloride: 101 mmol/L (ref 98–111)
Creatinine, Ser: 0.67 mg/dL (ref 0.44–1.00)
GFR, Estimated: >60 mL/min (ref >60)
Glucose, Bld: 127 mg/dL — ABNORMAL HIGH (ref 70–99)
Potassium: 3.9 mmol/L (ref 3.5–5.1)
Sodium: 136 mmol/L (ref 135–145)

## 2024-06-07 LAB — LIPOPROTEIN A (LPA): Lipoprotein (a): 11.4 nmol/L (ref <75.0)

## 2024-06-07 MED ORDER — POTASSIUM CHLORIDE CRYS ER 20 MEQ PO TBCR
20.0000 meq | EXTENDED_RELEASE_TABLET | Freq: Once | ORAL | Status: AC
  Administered 2024-06-07: 20 meq via ORAL
  Filled 2024-06-07: qty 1

## 2024-06-07 NOTE — Progress Notes (Signed)
   Heart Failure Stewardship Pharmacist Progress Note   PCP: Rolinda Millman, MD PCP-Cardiologist: None    HPI:  63 yo F with PMH of HLD.   Presented to the ED on 7/15 with chest, neck, and jaw pain after mowing the lawn. Symptoms associated with diaphoresis and nausea. Denied shortness of breath. Initial EKG with anterolateral STEMI with ST elevations in leads I and aVL and additional anterior elevations in leads V2, V3, and V4. Taken emergently to the cath lab and found to have total occlusion of proxLAD s/p PCI with DES. LVEFP 41. CXR with no active disease. ECHO on 7/16 with LVEF 40-45% (although per MD read - appears more like 30%), RWMA, RV normal, mild MR.   Met with patient and her family at bedside. Denies shortness of breath, chest pain, or palpitations. No LE edema on exam. She reports having ankle edema as well as fatigue and some shortness of breath on exertion in the weeks leading up to this admission but associated it more with traveling and the summer temperatures. Reviewed GDMT and goals of therapy again. No questions or concerns at this time.   Current HF Medications: Beta Blocker: metoprolol  XL 25 mg daily ACE/ARB/ARNI: losartan  12.5 mg daily SGLT2i: Farxiga  10 mg daily  Prior to admission HF Medications: None  Pertinent Lab Values: Serum creatinine 0.67, BUN 13, Potassium 3.9, Sodium 136, Magnesium 2.1, A1c 5.2   Vital Signs: Weight: 146 lbs  Blood pressure: 90-110/70s  Heart rate: 90-100s  I/O: incomplete  Medication Assistance / Insurance Benefits Check: Does the patient have prescription insurance?  Yes Type of insurance plan: Financial risk analyst  Outpatient Pharmacy:  Prior to admission outpatient pharmacy: [facility] Is the patient willing to use [facility] TOC pharmacy at discharge? Yes Is the patient willing to transition their outpatient pharmacy to utilize a [facility] outpatient pharmacy?   No    Assessment: 1. Acute systolic CHF (LVEF 30%),  due to ICM. NYHA class II symptoms. - LVEDP elevated on cath but does not appear volume overloaded on exam and CXR clear.  - Continue metoprolol  XL 25 mg daily - Continue losartan  12.5 mg daily - Consider adding spironolactone 12.5 mg daily tomorrow if BP stable  - Continue Farxiga  10 mg daily  Plan: 1) Medication changes recommended at this time: - Add spironolactone 12.5 mg daily tomorrow if BP stable  2) Patient assistance: - $3500 deductible remaining on insurance (medical + pharmacy costs) - Eligible for monthly copay cards - Farxiga  copay $588.24 until deductible met - copay card covers $150 per month. Can provide free 30 day on discharge in hopes that deductible will be met after this hospitalization for next month.  - Farxiga  copay card provided. Instructed to bring this to [facility] - Patient states they can afford the Farxiga  copay with the copay card via [facility] cards. No other assistance needed.   3)  Education  - Patient has been educated on current HF medications and potential additions to HF medication regimen - Patient verbalizes understanding that over the next few months, these medication doses may change and more medications may be added to optimize HF regimen - Patient has been educated on basic disease state pathophysiology and goals of therapy   Duwaine Plant, PharmD, BCPS Heart Failure Stewardship Pharmacist Phone [phone]

## 2024-06-07 NOTE — Progress Notes (Signed)
 Heart Failure Nurse Navigator Progress Note  PCP: Rolinda Millman, MD PCP-Cardiologist: Joylene Bergamo Admission Diagnosis: STEMI Admitted from: Home  Presentation:   [name] presented with central chest, neck, and jaw pain, diaphoretic, and nauseous after mowing lawn. On arrival, initial EKG showed an anterolateral STEMI with ST elevations in leads I and aVL with additional anterior elevations in leads V2, V3 and V4. BP 160/102, HR 102, Code STEMI called, emergent cardiac cath. Found to have a occluded proximal LAD, S/p successful PCI of the LAD with DES.   Patient and her husband were both educated on the sign and symptoms of heart failure, daily weights, when to call her doctor or go to the ED. Diet/ fluid restrictions, ( Patient reports to using salt when she cooks, however , she does not add salt to her food .) Continued education on taking all her medications as prescribed and attending all medical appointments. Patient and her husband verbalized their understanding of the education. A HF TOC appointment was scheduled for 06/13/2024 @ 9:45 am.   ECHO/ LVEF: 30% per MD - read as 40-45%.   Clinical Course:  Past Medical History:  Diagnosis Date   Hyperlipidemia      Social History   Socioeconomic History   Marital status: Married    Spouse name: Not on file   Number of children: Not on file   Years of education: Not on file   Highest education level: Not on file  Occupational History   Not on file  Tobacco Use   Smoking status: Never   Smokeless tobacco: Never  Vaping Use   Vaping status: Never Used  Substance and Sexual Activity   Alcohol use: Yes    Alcohol/week: 5.0 - 7.0 standard drinks of alcohol    Types: 5 - 7 Standard drinks or equivalent per week   Drug use: No   Sexual activity: Yes    Partners: Male    Birth control/protection: Post-menopausal  Other Topics Concern   Not on file  Social History Narrative   Not on file   Social Drivers of Health    Financial Resource Strain: Not on file  Food Insecurity: No Food Insecurity (06/06/2024)   Hunger Vital Sign    Worried About Running Out of Food in the Last Year: Never true    Ran Out of Food in the Last Year: Never true  Transportation Needs: No Transportation Needs (06/06/2024)   PRAPARE - Administrator, Civil Service (Medical): No    Lack of Transportation (Non-Medical): No  Physical Activity: Not on file  Stress: Not on file  Social Connections: Unknown (06/06/2024)   Social Connection and Isolation Panel    Frequency of Communication with Friends and Family: Not on file    Frequency of Social Gatherings with Friends and Family: Not on file    Attends Religious Services: Not on file    Active Member of Clubs or Organizations: Not on file    Attends Banker Meetings: Not on file    Marital Status: Married   Education Assessment and Provision:  Detailed education and instructions provided on heart failure disease management including the following:  Signs and symptoms of Heart Failure When to call the physician Importance of daily weights Low sodium diet Fluid restriction Medication management Anticipated future follow-up appointments  Patient education given on each of the above topics.  Patient acknowledges understanding via teach back method and acceptance of all instructions.  Education Materials:  Living Better With Heart Failure Booklet, HF zone tool, & Daily Weight Tracker Tool.  Patient has scale at home: Yes Patient has pill box at home: Yes    High Risk Criteria for Readmission and/or Poor Patient Outcomes: Heart failure hospital admissions (last 6 months): 0  No Show rate: 0 Difficult social situation: No, lives with her husband Demonstrates medication adherence: Yes Primary Language: English Literacy level: Reading, writing, and comprehension.   Barriers of Care:   Diet/ fluid restrictions ( salt with cooking) Daily  weights  Considerations/Referrals:   Referral made to Heart Failure Pharmacist Stewardship: Yes Referral made to Heart Failure CSW/NCM TOC: NA Referral made to Heart & Vascular TOC clinic: Yes, 06/13/2024 @ 9:45 am.   Items for Follow-up on DC/TOC: Continued HF education Diet/ fluid restrictions/ daily weights   Stephane Haddock, BSN, RN Heart Failure Teacher, adult education Only

## 2024-06-07 NOTE — Plan of Care (Signed)
  Problem: Education: Goal: Knowledge of General Education information will improve Description: Including pain rating scale, medication(s)/side effects and non-pharmacologic comfort measures Outcome: Progressing   Problem: Health Behavior/Discharge Planning: Goal: Ability to manage health-related needs will improve Outcome: Progressing   Problem: Clinical Measurements: Goal: Ability to maintain clinical measurements within normal limits will improve Outcome: Progressing Goal: Will remain free from infection Outcome: Progressing Goal: Respiratory complications will improve Outcome: Progressing Goal: Cardiovascular complication will be avoided Outcome: Progressing   Problem: Activity: Goal: Risk for activity intolerance will decrease Outcome: Progressing   Problem: Nutrition: Goal: Adequate nutrition will be maintained Outcome: Progressing   Problem: Coping: Goal: Level of anxiety will decrease Outcome: Progressing   Problem: Elimination: Goal: Will not experience complications related to urinary retention Outcome: Progressing

## 2024-06-07 NOTE — Progress Notes (Signed)
 Rounding Note   Patient Name: Maria Zamora Date of Encounter: 06/07/2024  [facility] Cardiologist: None new- Dr Ladona  Subjective Reports she had a lot of chest soreness last night. Improved with tylenol . Feels better today.,No dyspnea. No palpitations or dizziness.  Scheduled Meds:  aspirin   81 mg Oral Daily   atorvastatin   80 mg Oral Daily   Chlorhexidine  Gluconate Cloth  6 each Topical Daily   dapagliflozin  propanediol  10 mg Oral Daily   enoxaparin  (LOVENOX ) injection  40 mg Subcutaneous Q24H   losartan   12.5 mg Oral Daily   metoprolol  succinate  25 mg Oral Daily   ticagrelor   90 mg Oral BID   Continuous Infusions:   PRN Meds: acetaminophen , nitroGLYCERIN , ondansetron  (ZOFRAN ) IV, mouth rinse, sodium chloride  flush   Vital Signs  Vitals:   06/07/24 0700 06/07/24 0715 06/07/24 0730 06/07/24 0745  BP: (!) 84/51  93/63   Pulse: (!) 111 (!) 112 (!) 105 97  Resp: (!) 23 (!) 21 (!) 29 (!) 24  Temp: 98.7 F (37.1 C)     TempSrc: Oral     SpO2: 92% 95% 95% 95%  Weight:      Height:       No intake or output data in the 24 hours ending 06/07/24 0833     06/05/2024   11:15 PM 06/05/2024    5:41 PM 03/15/2024    8:58 AM  Last 3 Weights  Weight (lbs) 146 lb 6.2 oz 155 lb 152 lb  Weight (kg) 66.4 kg 70.308 kg 68.947 kg      Telemetry  - NSR - no further NSVTPersonally Reviewed  ECG  NSR with Q waves across anterior precordium. Mild residual ST elevation - Personally Reviewed  Physical Exam  GEN: No acute distress.   Neck: No JVD Cardiac: RRR, no murmurs, rubs, or gallops.  Respiratory: Clear to auscultation bilaterally. GI: Soft, nontender, non-distended  MS: No edema; No deformity. Radial site is normal.  Neuro:  Nonfocal  Psych: Normal affect   Labs High Sensitivity Troponin:  No results for input(s): TROPONINIHS in the last 720 hours.   Chemistry Recent Labs  Lab 06/05/24 1754 06/05/24 2341  NA 133* 133*  K 3.9 4.1  CL 99 101   CO2 19* 23  GLUCOSE 157* 137*  BUN 19 13  CREATININE 0.96 0.72  CALCIUM  9.2 8.7*  MG  --  2.1  GFRNONAA >60 >60  ANIONGAP 15 9    Lipids  Recent Labs  Lab 06/05/24 1754  CHOL 225*  TRIG 225*  HDL 47  LDLCALC 133*  CHOLHDL 4.8    Hematology Recent Labs  Lab 06/05/24 1754 06/05/24 2341  WBC 13.4* 15.5*  RBC 4.94 4.64  HGB 14.7 13.8  HCT 44.9 41.6  MCV 90.9 89.7  MCH 29.8 29.7  MCHC 32.7 33.2  RDW 12.4 12.2  PLT 255 265   Thyroid No results for input(s): TSH, FREET4 in the last 168 hours.  BNPNo results for input(s): BNP, PROBNP in the last 168 hours.  DDimer No results for input(s): DDIMER in the last 168 hours.   Radiology  CARDIAC CATHETERIZATION Addendum Date: 06/06/2024 Cardiac Catheterization 06/05/24: Hemodynamic data: LV 136, 19, EDP 41 mmHg.  Ao ao 131/86, mean 102 mmHg.  There is no pressure gradient across the aortic valve. Angiographic data: LV: Anterior, anterolateral and apical akinesis with EF 25 to 30%. LM: Large-caliber vessel.  Smooth and normal. LCx: It is a moderate caliber vessel and  tortuous.  Proximal to mid CX is mild to moderately diffusely diseased with a very tiny OM1 with ostial 70 to 80% stenosis and an OM 2 about 40 to 50% stenosis which is large and mild disease in OM 3. LAD: It is a large vessel, occluded in the proximal segment.  Gives origin to a large D1 which has secondary branches.  There is mild disease in the proximal and mid LAD. RCA: Very large caliber vessel, gives origin to large PDA and PL branch, smooth and normal. Intervention data: Successful PTCA and stenting of the proximal LAD with implantation of a 3.5 x 16 mm Synergy XD DES deployed at 16 atmospheric pressure, stenosis reduced from 100% to 0% with TIMI 0 to TIMI-3 flow.  Large D1 90% sidebranch occlusion has been balloon angioplasty with 2.0 x 12 mm emerge at 3 atmospheric pressure, less than 10% stenosis with TIMI-3 flow improved from TIMI II flow. Impression and  recommendations: Patient will need aggressive risk modification in view of moderate diffuse disease in the circumflex and also presentation with acute STEMI.  DAPT with aspirin  and Brilinta  for a year.  Result Date: 06/06/2024 Images from the original result were not included. Cardiac Catheterization 06/05/24: Hemodynamic data: LV 136, 19, EDP 41 mmHg.  Ao ao 131/86, mean 102 mmHg.  There is no pressure gradient across the aortic valve. Angiographic data: LM: Large-caliber vessel.  Smooth and normal. LCx: It is a moderate caliber vessel and tortuous.  Proximal to mid CX is mild to moderately diffusely diseased with a very tiny OM1 with ostial 70 to 80% stenosis and an OM 2 about 40 to 50% stenosis which is large and mild disease in OM 3. LAD: It is a large vessel, occluded in the proximal segment.  Gives origin to a large D1 which has secondary branches.  There is mild disease in the proximal and mid LAD. RCA: Very large caliber vessel, gives origin to large PDA and PL branch, smooth and normal. Intervention data: Successful PTCA and stenting of the proximal LAD with implantation of a 3.5 x 16 mm Synergy XD DES deployed at 16 atmospheric pressure, stenosis reduced from 100% to 0% with TIMI 0 to TIMI-3 flow.  Large D1 90% sidebranch occlusion has been balloon angioplasty with 2.0 x 12 mm emerge at 3 atmospheric pressure, less than 10% stenosis with TIMI-3 flow improved from TIMI II flow. Impression and recommendations: Patient will need aggressive risk modification in view of moderate diffuse disease in the circumflex and also presentation with acute STEMI.  DAPT with aspirin  and Brilinta  for a year.   ECHOCARDIOGRAM COMPLETE Result Date: 06/06/2024    ECHOCARDIOGRAM REPORT   Patient Name:   Maria Zamora Date of Exam: 06/06/2024 Medical Rec #:  [id number]         Height:       64.0 in Accession #:    [id number]        Weight:       146.4 lb Date of Birth:  1960-12-21        BSA:          1.713 m Patient Age:     63 years          BP:           112/78 mmHg Patient Gender: F                 HR:           92 bpm. Exam  Location:  Inpatient Procedure: 2D Echo, Cardiac Doppler, Color Doppler and Intracardiac            Opacification Agent (Both Spectral and Color Flow Doppler were            utilized during procedure). Indications:    Acute ST elevation myocardial infarction (STEMI) of                 anterolateral wall  History:        Patient has no prior history of Echocardiogram examinations.  Sonographer:    Philomena Daring Referring Phys: [id number] GORDY BERGAMO IMPRESSIONS  1. Left ventricular ejection fraction, by estimation, is 40 to 45%. The left ventricle has mildly decreased function. The left ventricle demonstrates regional wall motion abnormalities (see scoring diagram/findings for description). There is mild concentric left ventricular hypertrophy. Left ventricular diastolic parameters are indeterminate.  2. Right ventricular systolic function is normal. The right ventricular size is normal. Tricuspid regurgitation signal is inadequate for assessing PA pressure.  3. The mitral valve is normal in structure. Mild mitral valve regurgitation. No evidence of mitral stenosis.  4. The aortic valve is normal in structure. Aortic valve regurgitation is not visualized. No aortic stenosis is present.  5. The inferior vena cava is normal in size with greater than 50% respiratory variability, suggesting right atrial pressure of 3 mmHg. FINDINGS  Left Ventricle: Left ventricular ejection fraction, by estimation, is 40 to 45%. The left ventricle has mildly decreased function. The left ventricle demonstrates regional wall motion abnormalities. The left ventricular internal cavity size was normal in size. There is mild concentric left ventricular hypertrophy. Left ventricular diastolic parameters are indeterminate.  LV Wall Scoring: The basal anterolateral segment, mid inferoseptal segment, apical septal segment, basal anterior segment, and  apex are akinetic. The mid and distal anterior wall, mid and distal lateral wall, anterior septum, entire inferior wall, posterior wall, mid anterolateral segment, and basal inferoseptal segment are hypokinetic. Right Ventricle: The right ventricular size is normal. No increase in right ventricular wall thickness. Right ventricular systolic function is normal. Tricuspid regurgitation signal is inadequate for assessing PA pressure. Left Atrium: Left atrial size was normal in size. Right Atrium: Right atrial size was normal in size. Pericardium: There is no evidence of pericardial effusion. Presence of epicardial fat layer. Mitral Valve: The mitral valve is normal in structure. Mild mitral valve regurgitation. No evidence of mitral valve stenosis. Tricuspid Valve: The tricuspid valve is normal in structure. Tricuspid valve regurgitation is mild . No evidence of tricuspid stenosis. Aortic Valve: The aortic valve is normal in structure. Aortic valve regurgitation is not visualized. No aortic stenosis is present. Pulmonic Valve: The pulmonic valve was normal in structure. Pulmonic valve regurgitation is not visualized. No evidence of pulmonic stenosis. Aorta: The aortic root is normal in size and structure. Venous: The inferior vena cava is normal in size with greater than 50% respiratory variability, suggesting right atrial pressure of 3 mmHg. IAS/Shunts: No atrial level shunt detected by color flow Doppler.  LEFT VENTRICLE PLAX 2D LVIDd:         4.50 cm   Diastology LVIDs:         3.40 cm   LV e' medial:    7.51 cm/s LV PW:         1.00 cm   LV E/e' medial:  9.0 LV IVS:        1.00 cm   LV e' lateral:   6.53 cm/s LVOT diam:  2.00 cm   LV E/e' lateral: 10.4 LV SV:         48 LV SV Index:   28 LVOT Area:     3.14 cm  RIGHT VENTRICLE             IVC RV S prime:     12.30 cm/s  IVC diam: 2.20 cm TAPSE (M-mode): 2.0 cm LEFT ATRIUM           Index        RIGHT ATRIUM          Index LA diam:      3.20 cm 1.87 cm/m   RA  Area:     9.06 cm LA Vol (A2C): 44.6 ml 26.03 ml/m  RA Volume:   17.80 ml 10.39 ml/m LA Vol (A4C): 41.6 ml 24.28 ml/m  AORTIC VALVE LVOT Vmax:   82.80 cm/s LVOT Vmean:  57.600 cm/s LVOT VTI:    0.153 m  AORTA Ao Root diam: 2.60 cm Ao Asc diam:  2.80 cm MITRAL VALVE MV Area (PHT): 4.39 cm    SHUNTS MV Decel Time: 173 msec    Systemic VTI:  0.15 m MV E velocity: 67.60 cm/s  Systemic Diam: 2.00 cm MV A velocity: 79.10 cm/s MV E/A ratio:  0.85 Kardie Tobb DO Electronically signed by Dub Huntsman DO Signature Date/Time: 06/06/2024/12:33:32 PM    Final    DG Chest Port 1 View Result Date: 06/05/2024 CLINICAL DATA:  STEMI. EXAM: PORTABLE CHEST 1 VIEW COMPARISON:  None Available. FINDINGS: The heart size and mediastinal contours are within normal limits. Both lungs are clear. The visualized skeletal structures are unremarkable. IMPRESSION: No active disease. Electronically Signed   By: Lynwood Landy Raddle M.D.   On: 06/05/2024 18:05    Cardiac Studies LEFT HEART CATH AND CORONARY ANGIOGRAPHY  Coronary/Graft Acute MI Revascularization   Conclusion  Cardiac Catheterization 06/05/24: Hemodynamic data: LV 136, 19, EDP 41 mmHg.  Ao ao 131/86, mean 102 mmHg.  There is no pressure gradient across the aortic valve.   Angiographic data: LM: Large-caliber vessel.  Smooth and normal. LCx: It is a moderate caliber vessel and tortuous.  Proximal to mid CX is mild to moderately diffusely diseased with a very tiny OM1 with ostial 70 to 80% stenosis and an OM 2 about 40 to 50% stenosis which is large and mild disease in OM 3. LAD: It is a large vessel, occluded in the proximal segment.  Gives origin to a large D1 which has secondary branches.  There is mild disease in the proximal and mid LAD. RCA: Very large caliber vessel, gives origin to large PDA and PL branch, smooth and normal.   Intervention data: Successful PTCA and stenting of the proximal LAD with implantation of a 3.5 x 16 mm Synergy XD DES deployed at 16  atmospheric pressure, stenosis reduced from 100% to 0% with TIMI 0 to TIMI-3 flow.  Large D1 90% sidebranch occlusion has been balloon angioplasty with 2.0 x 12 mm emerge at 3 atmospheric pressure, less than 10% stenosis with TIMI-3 flow improved from TIMI II flow.  Patient Profile   63 y.o. female with HLD presents with acute anterior STEMI  Assessment & Plan  Acute anterior STEMI. S/p successful PCI of the LAD with DES and rescue POBA of first diagonal. Asymptomatic today. High EDP at cath 41 mm Hg. Does not have rales on exam and CXR clear. I personally reviewed Echo and disagree with interpretation. EF is 25-30%. Confirmed  independently with Dr Francyne. This is c/w interpretation at time of cath as well. Initiate heart failure therapy with low dose ARB and beta blocker. Titration limited by low BP. Consider aldactone tomorrow if able. On Farxiga . I would recommend Lifevest at DC.  Acute systolic CHF. EF 25-30%. Plan as per #1 NSVT. secondary to reperfusion. Improved HLD on high dose statin Will transfer to telemetry today. Ambulate. Anticipate DC home tomorrow if stable.      For questions or updates, please contact [facility] Please consult [email or web address] for contact info under     Signed, Kirin Pastorino [place], MD  06/07/2024, 8:33 AM

## 2024-06-08 ENCOUNTER — Other Ambulatory Visit (HOSPITAL_COMMUNITY): Payer: Self-pay

## 2024-06-08 DIAGNOSIS — E785 Hyperlipidemia, unspecified: Secondary | ICD-10-CM | POA: Insufficient documentation

## 2024-06-08 DIAGNOSIS — I5020 Unspecified systolic (congestive) heart failure: Secondary | ICD-10-CM | POA: Insufficient documentation

## 2024-06-08 LAB — BASIC METABOLIC PANEL WITH GFR
Anion gap: 11 (ref 5–15)
BUN: 15 mg/dL (ref 6–20)
CO2: 25 mmol/L (ref 22–32)
Calcium: 8.8 mg/dL — ABNORMAL LOW (ref 8.9–10.3)
Chloride: 100 mmol/L (ref 98–111)
Creatinine, Ser: 0.74 mg/dL (ref 0.44–1.00)
GFR, Estimated: >60 mL/min (ref >60)
Glucose, Bld: 96 mg/dL (ref 70–99)
Potassium: 4.1 mmol/L (ref 3.5–5.1)
Sodium: 136 mmol/L (ref 135–145)

## 2024-06-08 MED ORDER — TICAGRELOR 90 MG PO TABS
90.0000 mg | ORAL_TABLET | Freq: Two times a day (BID) | ORAL | 11 refills | Status: AC
  Filled 2024-06-08: qty 60, 30d supply, fill #0

## 2024-06-08 MED ORDER — LOSARTAN POTASSIUM 25 MG PO TABS
12.5000 mg | ORAL_TABLET | Freq: Every day | ORAL | 6 refills | Status: DC
  Filled 2024-06-08: qty 30, 60d supply, fill #0

## 2024-06-08 MED ORDER — ASPIRIN 81 MG PO CHEW
81.0000 mg | CHEWABLE_TABLET | Freq: Every day | ORAL | 2 refills | Status: DC
  Filled 2024-06-08: qty 120, 120d supply, fill #0

## 2024-06-08 MED ORDER — METOPROLOL SUCCINATE ER 25 MG PO TB24
25.0000 mg | ORAL_TABLET | Freq: Every day | ORAL | 6 refills | Status: DC
  Filled 2024-06-08: qty 30, 30d supply, fill #0

## 2024-06-08 MED ORDER — ASPIRIN 81 MG PO CHEW
81.0000 mg | CHEWABLE_TABLET | Freq: Every day | ORAL | 11 refills | Status: DC
  Filled 2024-06-08: qty 30, 30d supply, fill #0

## 2024-06-08 MED ORDER — DAPAGLIFLOZIN PROPANEDIOL 10 MG PO TABS
10.0000 mg | ORAL_TABLET | Freq: Every day | ORAL | 11 refills | Status: DC
  Filled 2024-06-08: qty 30, 30d supply, fill #0

## 2024-06-08 MED ORDER — NITROGLYCERIN 0.4 MG SL SUBL
0.4000 mg | SUBLINGUAL_TABLET | SUBLINGUAL | 3 refills | Status: AC | PRN
  Filled 2024-06-08: qty 25, 20d supply, fill #0

## 2024-06-08 MED ORDER — ASPIRIN 81 MG PO TBEC
81.0000 mg | DELAYED_RELEASE_TABLET | Freq: Every day | ORAL | 2 refills | Status: AC
  Filled 2024-06-08: qty 120, 120d supply, fill #0

## 2024-06-08 MED ORDER — ATORVASTATIN CALCIUM 80 MG PO TABS
80.0000 mg | ORAL_TABLET | Freq: Every day | ORAL | 11 refills | Status: AC
  Filled 2024-06-08: qty 30, 30d supply, fill #0

## 2024-06-08 NOTE — TOC Transition Note (Addendum)
 Transition of Care [facility]) - Discharge Note   Patient Details  Name: Maria Zamora MRN: [id number] Date of Birth: [birth year]  Transition [facility]) CM/SW Contact:  Waddell Barnie Rama, RN Phone Number: 06/08/2024, 9:54 AM   Clinical Narrative:     For dc today, she was fitted with life vest yesterday and states she knows how to put in on. She has transport at dc. Has a high deductible for meds.  Pharmacy has spoken to her regarding her meds.        Patient Goals and CMS Choice            Discharge Placement                       Discharge Plan and Services Additional resources added to the After Visit Summary for                                       Social Drivers of Health (SDOH) Interventions SDOH Screenings   Food Insecurity: No Food Insecurity (06/06/2024)  Housing: Low Risk  (06/06/2024)  Transportation Needs: No Transportation Needs (06/06/2024)  Utilities: Not At Risk (06/06/2024)  Alcohol Screen: Low Risk  (06/07/2024)  Depression (PHQ2-9): Low Risk  (03/15/2024)  Financial Resource Strain: Low Risk  (06/07/2024)  Social Connections: Unknown (06/06/2024)  Tobacco Use: Low Risk  (06/06/2024)     Readmission Risk Interventions     No data to display

## 2024-06-08 NOTE — Discharge Summary (Signed)
 Discharge Summary   Patient ID: Maria Zamora MRN: [id number]; DOB: 08-Sep-1961  Admit date: 06/05/2024 Discharge date: 06/08/2024  PCP:  Rolinda Millman, MD   [facility] Providers Cardiologist:  Gordy Bergamo, MD   Discharge Diagnoses  Principal Problem:   Acute ST elevation myocardial infarction (STEMI) of anterolateral wall [facility]) Active Problems:   HLD (hyperlipidemia)   HFrEF (heart failure with reduced ejection fraction) (HCC)   Diagnostic Studies/Procedures  Cardiac Catheterization 06/05/24: Hemodynamic data: LV 136, 19, EDP 41 mmHg.  Ao ao 131/86, mean 102 mmHg.  There is no pressure gradient across the aortic valve.   Angiographic data: LV: Anterior, anterolateral and apical akinesis with EF 25 to 30%. LM: Large-caliber vessel.  Smooth and normal. LCx: It is a moderate caliber vessel and tortuous.  Proximal to mid CX is mild to moderately diffusely diseased with a very tiny OM1 with ostial 70 to 80% stenosis and an OM 2 about 40 to 50% stenosis which is large and mild disease in OM 3. LAD: It is a large vessel, occluded in the proximal segment.  Gives origin to a large D1 which has secondary branches.  There is mild disease in the proximal and mid LAD. RCA: Very large caliber vessel, gives origin to large PDA and PL branch, smooth and normal.   Intervention data: Successful PTCA and stenting of the proximal LAD with implantation of a 3.5 x 16 mm Synergy XD DES deployed at 16 atmospheric pressure, stenosis reduced from 100% to 0% with TIMI 0 to TIMI-3 flow.  Large D1 90% sidebranch occlusion has been balloon angioplasty with 2.0 x 12 mm emerge at 3 atmospheric pressure, less than 10% stenosis with TIMI-3 flow improved from TIMI II flow.      Impression and recommendations: Patient will need aggressive risk modification in view of moderate diffuse disease in the circumflex and also presentation with acute STEMI.  DAPT with aspirin  and Brilinta  for a  year.  Echocardiogram 06/06/2024  1. Left ventricular ejection fraction, by estimation, is 40 to 45%. The  left ventricle has mildly decreased function. The left ventricle  demonstrates regional wall motion abnormalities (see scoring  diagram/findings for description). There is mild  concentric left ventricular hypertrophy. Left ventricular diastolic  parameters are indeterminate.   2. Right ventricular systolic function is normal. The right ventricular  size is normal. Tricuspid regurgitation signal is inadequate for assessing  PA pressure.   3. The mitral valve is normal in structure. Mild mitral valve  regurgitation. No evidence of mitral stenosis.   4. The aortic valve is normal in structure. Aortic valve regurgitation is  not visualized. No aortic stenosis is present.   5. The inferior vena cava is normal in size with greater than 50%  respiratory variability, suggesting right atrial pressure of 3 mmHg.   _____________   History of Present Illness   Maria Zamora is a 63 y.o. female with hyperlipidemia.  No prior cardiac history.  Very strong history of MIs throughout her family.  Of note she was not on any prescribed medications prior to admission.  Patient initially presented 06/05/2024 with acute neck/jaw pain that was associated with diaphoresis and nausea and vomiting while mowing the grass.  Started to have chest pain.  In the emergency room EKG confirmed anterolateral STEMI.  She was taken emergently to the cardiac cath lab.   Hospital Course    Anterolateral STEMI HLD Status post successful PTCA/stenting of the proximal LAD and had balloon angioplasty to  D1.  She has had no further complaints of chest pain. Plan for DAPT with aspirin  and Brilinta  for at least 1 year, continue Toprol -XL 25 mg, atorvastatin  80 mg, will discharge on nitroglycerin . LDL is 133 this admission.  Need to repeat LFTs/lipid panel again in 2 months.  LP(a) 11.4. Discharging on LifeVest for at  least 3 months  Ischemic cardiomyopathy with reduced EF Echo was read as 40 to 45%, felt to be closer to 25 to 30%.  With RWMA.  Normal RV.  Mild MR.  She has been well compensated and without any significant issues of congestion here despite elevated LVEDP. Feels warm and perfused.  GDMT: She has been started on Farxiga , Toprol -XL 25 mg, losartan  12.5 mg.  GDMT has been limited by soft blood pressures. Discussed ongoing management of CHF and need for daily weights and blood pressures. Discharge weight 151 Repeat labs in 2 weeks, please arrange at [facility] follow up.  NSVT Likely related to scar tissue, seems to be more quiescent towards the latter part of admission.   Patient seen by myself and Dr. [place] and deemed stable for discharge.  Follow-up has been arranged and she will see the [facility] on the 23rd.  Medications will be sent to [facility] pharmacy.  Radial cath site free of any acute complications.      Did the patient have an acute coronary syndrome (MI, NSTEMI, STEMI, etc) this admission?:  Yes                               AHA/ACC ACS Clinical Performance & Quality Measures: Aspirin  prescribed? - Yes ADP Receptor Inhibitor (Plavix/Clopidogrel, Brilinta /Ticagrelor  or Effient/Prasugrel) prescribed (includes medically managed patients)? - Yes Beta Blocker prescribed? - Yes High Intensity Statin (Lipitor 40-80mg  or Crestor 20-40mg ) prescribed? - Yes EF assessed during THIS hospitalization? - Yes For EF <40%, was ACEI/ARB prescribed? - Yes For EF <40%, Aldosterone Antagonist (Spironolactone or Eplerenone) prescribed? - No - Reason:  soft BP Cardiac Rehab Phase II ordered (including medically managed patients)? - Yes      The patient will be scheduled for a TOC follow up appointment in 14 days.  A message has been sent to the [facility] and Scheduling Pool at the office where the patient should be seen for follow up.  _____________  Discharge Vitals Blood pressure 103/66, pulse 100,  temperature 98.4 F (36.9 C), temperature source Oral, resp. rate 18, height 5' 4 (1.626 m), weight 68.6 kg, last menstrual period 11/18/2015, SpO2 98%.  Filed Weights   06/05/24 2315 06/07/24 1214 06/08/24 0454  Weight: 66.4 kg 69.2 kg 68.6 kg   Physical Exam Cardiovascular:     Rate and Rhythm: Normal rate and regular rhythm.     Pulses: Normal pulses.     Heart sounds: Normal heart sounds.  Pulmonary:     Effort: Pulmonary effort is normal.     Breath sounds: Normal breath sounds.  Abdominal:     General: Abdomen is flat.     Palpations: Abdomen is soft.  Skin:    General: Skin is warm.     Capillary Refill: Capillary refill takes less than 2 seconds.  Neurological:     Mental Status: She is alert.    Radial cath site with no signs of discharge, erythema, pus.  Labs & Radiologic Studies  CBC Recent Labs    06/05/24 2341 06/07/24 0824  WBC 15.5* 15.3*  HGB 13.8 14.2  HCT 41.6 43.1  MCV 89.7 90.5  PLT 265 267   Basic Metabolic Panel Recent Labs    92/84/74 2341 06/07/24 0824 06/08/24 0218  NA 133* 136 136  K 4.1 3.9 4.1  CL 101 101 100  CO2 23 25 25   GLUCOSE 137* 127* 96  BUN 13 13 15   CREATININE 0.72 0.67 0.74  CALCIUM  8.7* 9.3 8.8*  MG 2.1  --   --    Liver Function Tests No results for input(s): AST, ALT, ALKPHOS, BILITOT, PROT, ALBUMIN in the last 72 hours. No results for input(s): LIPASE, AMYLASE in the last 72 hours. High Sensitivity Troponin:   No results for input(s): TROPONINIHS in the last 720 hours.  Recent Labs  Lab 06/05/24 1754  TRNPT 114*    BNP Invalid input(s): POCBNP No results for input(s): PROBNP in the last 72 hours.  No results for input(s): BNP in the last 72 hours.  D-Dimer No results for input(s): DDIMER in the last 72 hours. Hemoglobin A1C Recent Labs    06/05/24 1754  HGBA1C 5.2   Fasting Lipid Panel Recent Labs    06/05/24 1754  CHOL 225*  HDL 47  LDLCALC 133*  TRIG 225*   CHOLHDL 4.8   Lipoprotein (a)  Date/Time Value Ref Range Status  06/05/2024 11:41 PM 11.4 <75.0 nmol/L Final    Comment:    (NOTE) This test was developed and its performance characteristics determined by [facility]. It has not been cleared or approved by the Food and Drug Administration. Note:  Values greater than or equal to 75.0 nmol/L may       indicate an independent risk factor for CHD,       but must be evaluated with caution when applied       to non-Caucasian populations due to the       influence of genetic factors on Lp(a) across       ethnicities. Performed At: [facility] [address] [place], [place] [id number] Jennette Shorter MD Ey:[phone]     Thyroid Function Tests No results for input(s): TSH, T4TOTAL, T3FREE, THYROIDAB in the last 72 hours.  Invalid input(s): FREET3 _____________  CARDIAC CATHETERIZATION Addendum Date: 06/06/2024 Cardiac Catheterization 06/05/24: Hemodynamic data: LV 136, 19, EDP 41 mmHg.  Ao ao 131/86, mean 102 mmHg.  There is no pressure gradient across the aortic valve. Angiographic data: LV: Anterior, anterolateral and apical akinesis with EF 25 to 30%. LM: Large-caliber vessel.  Smooth and normal. LCx: It is a moderate caliber vessel and tortuous.  Proximal to mid CX is mild to moderately diffusely diseased with a very tiny OM1 with ostial 70 to 80% stenosis and an OM 2 about 40 to 50% stenosis which is large and mild disease in OM 3. LAD: It is a large vessel, occluded in the proximal segment.  Gives origin to a large D1 which has secondary branches.  There is mild disease in the proximal and mid LAD. RCA: Very large caliber vessel, gives origin to large PDA and PL branch, smooth and normal. Intervention data: Successful PTCA and stenting of the proximal LAD with implantation of a 3.5 x 16 mm Synergy XD DES deployed at 16 atmospheric pressure, stenosis reduced from 100% to 0% with TIMI 0 to TIMI-3 flow.  Large D1 90% sidebranch  occlusion has been balloon angioplasty with 2.0 x 12 mm emerge at 3 atmospheric pressure, less than 10% stenosis with TIMI-3 flow improved from TIMI II flow. Impression and recommendations: Patient will  need aggressive risk modification in view of moderate diffuse disease in the circumflex and also presentation with acute STEMI.  DAPT with aspirin  and Brilinta  for a year.  Result Date: 06/06/2024 Images from the original result were not included. Cardiac Catheterization 06/05/24: Hemodynamic data: LV 136, 19, EDP 41 mmHg.  Ao ao 131/86, mean 102 mmHg.  There is no pressure gradient across the aortic valve. Angiographic data: LM: Large-caliber vessel.  Smooth and normal. LCx: It is a moderate caliber vessel and tortuous.  Proximal to mid CX is mild to moderately diffusely diseased with a very tiny OM1 with ostial 70 to 80% stenosis and an OM 2 about 40 to 50% stenosis which is large and mild disease in OM 3. LAD: It is a large vessel, occluded in the proximal segment.  Gives origin to a large D1 which has secondary branches.  There is mild disease in the proximal and mid LAD. RCA: Very large caliber vessel, gives origin to large PDA and PL branch, smooth and normal. Intervention data: Successful PTCA and stenting of the proximal LAD with implantation of a 3.5 x 16 mm Synergy XD DES deployed at 16 atmospheric pressure, stenosis reduced from 100% to 0% with TIMI 0 to TIMI-3 flow.  Large D1 90% sidebranch occlusion has been balloon angioplasty with 2.0 x 12 mm emerge at 3 atmospheric pressure, less than 10% stenosis with TIMI-3 flow improved from TIMI II flow. Impression and recommendations: Patient will need aggressive risk modification in view of moderate diffuse disease in the circumflex and also presentation with acute STEMI.  DAPT with aspirin  and Brilinta  for a year.   ECHOCARDIOGRAM COMPLETE Result Date: 06/06/2024    ECHOCARDIOGRAM REPORT   Patient Name:   Maria Zamora Date of Exam: 06/06/2024 Medical  Rec #:  [id number]         Height:       64.0 in Accession #:    [id number]        Weight:       146.4 lb Date of Birth:  12-13-1960        BSA:          1.713 m Patient Age:    62 years          BP:           112/78 mmHg Patient Gender: F                 HR:           92 bpm. Exam Location:  Inpatient Procedure: 2D Echo, Cardiac Doppler, Color Doppler and Intracardiac            Opacification Agent (Both Spectral and Color Flow Doppler were            utilized during procedure). Indications:    Acute ST elevation myocardial infarction (STEMI) of                 anterolateral wall  History:        Patient has no prior history of Echocardiogram examinations.  Sonographer:    Philomena Daring Referring Phys: [id number] GORDY BERGAMO IMPRESSIONS  1. Left ventricular ejection fraction, by estimation, is 40 to 45%. The left ventricle has mildly decreased function. The left ventricle demonstrates regional wall motion abnormalities (see scoring diagram/findings for description). There is mild concentric left ventricular hypertrophy. Left ventricular diastolic parameters are indeterminate.  2. Right ventricular systolic function is normal. The right ventricular size is normal. Tricuspid regurgitation  signal is inadequate for assessing PA pressure.  3. The mitral valve is normal in structure. Mild mitral valve regurgitation. No evidence of mitral stenosis.  4. The aortic valve is normal in structure. Aortic valve regurgitation is not visualized. No aortic stenosis is present.  5. The inferior vena cava is normal in size with greater than 50% respiratory variability, suggesting right atrial pressure of 3 mmHg. FINDINGS  Left Ventricle: Left ventricular ejection fraction, by estimation, is 40 to 45%. The left ventricle has mildly decreased function. The left ventricle demonstrates regional wall motion abnormalities. The left ventricular internal cavity size was normal in size. There is mild concentric left ventricular hypertrophy. Left  ventricular diastolic parameters are indeterminate.  LV Wall Scoring: The basal anterolateral segment, mid inferoseptal segment, apical septal segment, basal anterior segment, and apex are akinetic. The mid and distal anterior wall, mid and distal lateral wall, anterior septum, entire inferior wall, posterior wall, mid anterolateral segment, and basal inferoseptal segment are hypokinetic. Right Ventricle: The right ventricular size is normal. No increase in right ventricular wall thickness. Right ventricular systolic function is normal. Tricuspid regurgitation signal is inadequate for assessing PA pressure. Left Atrium: Left atrial size was normal in size. Right Atrium: Right atrial size was normal in size. Pericardium: There is no evidence of pericardial effusion. Presence of epicardial fat layer. Mitral Valve: The mitral valve is normal in structure. Mild mitral valve regurgitation. No evidence of mitral valve stenosis. Tricuspid Valve: The tricuspid valve is normal in structure. Tricuspid valve regurgitation is mild . No evidence of tricuspid stenosis. Aortic Valve: The aortic valve is normal in structure. Aortic valve regurgitation is not visualized. No aortic stenosis is present. Pulmonic Valve: The pulmonic valve was normal in structure. Pulmonic valve regurgitation is not visualized. No evidence of pulmonic stenosis. Aorta: The aortic root is normal in size and structure. Venous: The inferior vena cava is normal in size with greater than 50% respiratory variability, suggesting right atrial pressure of 3 mmHg. IAS/Shunts: No atrial level shunt detected by color flow Doppler.  LEFT VENTRICLE PLAX 2D LVIDd:         4.50 cm   Diastology LVIDs:         3.40 cm   LV e' medial:    7.51 cm/s LV PW:         1.00 cm   LV E/e' medial:  9.0 LV IVS:        1.00 cm   LV e' lateral:   6.53 cm/s LVOT diam:     2.00 cm   LV E/e' lateral: 10.4 LV SV:         48 LV SV Index:   28 LVOT Area:     3.14 cm  RIGHT VENTRICLE              IVC RV S prime:     12.30 cm/s  IVC diam: 2.20 cm TAPSE (M-mode): 2.0 cm LEFT ATRIUM           Index        RIGHT ATRIUM          Index LA diam:      3.20 cm 1.87 cm/m   RA Area:     9.06 cm LA Vol (A2C): 44.6 ml 26.03 ml/m  RA Volume:   17.80 ml 10.39 ml/m LA Vol (A4C): 41.6 ml 24.28 ml/m  AORTIC VALVE LVOT Vmax:   82.80 cm/s LVOT Vmean:  57.600 cm/s LVOT VTI:    0.153  m  AORTA Ao Root diam: 2.60 cm Ao Asc diam:  2.80 cm MITRAL VALVE MV Area (PHT): 4.39 cm    SHUNTS MV Decel Time: 173 msec    Systemic VTI:  0.15 m MV E velocity: 67.60 cm/s  Systemic Diam: 2.00 cm MV A velocity: 79.10 cm/s MV E/A ratio:  0.85 Kardie Tobb DO Electronically signed by Dub Huntsman DO Signature Date/Time: 06/06/2024/12:33:32 PM    Final    DG Chest Port 1 View Result Date: 06/05/2024 CLINICAL DATA:  STEMI. EXAM: PORTABLE CHEST 1 VIEW COMPARISON:  None Available. FINDINGS: The heart size and mediastinal contours are within normal limits. Both lungs are clear. The visualized skeletal structures are unremarkable. IMPRESSION: No active disease. Electronically Signed   By: Lynwood Landy Raddle M.D.   On: 06/05/2024 18:05    Disposition Pt is being discharged home today in good condition.  Follow-up Plans & Appointments  Follow-up Information     [facility]. Go in 5 day(s).   Specialty: Cardiology Why: Hospital follow up 06/13/2024 @ 9:45 am PLEASE bring a current medication list to appointment FREE valet parking, Entrance C, off [facility] entrance Contact information: [address] [place]  [address] [phone]               Discharge Instructions     Amb Referral to Cardiac Rehabilitation   Complete by: As directed    Diagnosis:  Coronary Stents STEMI PTCA     After initial evaluation and assessments completed: Virtual Based Care may be provided alone or in conjunction with Phase 2 Cardiac  Rehab based on patient barriers.: Yes   Intensive Cardiac Rehabilitation (ICR) [facility] location only OR Traditional Cardiac Rehabilitation (TCR) *If criteria for ICR are not met will enroll in TCR [facility] only): Yes   Diet - low sodium heart healthy   Complete by: As directed    Discharge instructions   Complete by: As directed    Radial Site Care Refer to this sheet in the next few weeks. These instructions provide you with information on caring for yourself after your procedure. Your caregiver may also give you more specific instructions. Your treatment has been planned according to current medical practices, but problems sometimes occur. Call your caregiver if you have any problems or questions after your procedure. HOME CARE INSTRUCTIONS You may shower the day after the procedure. Remove the bandage (dressing) and gently wash the site with plain soap and water. Gently pat the site dry.  Do not apply powder or lotion to the site.  Do not submerge the affected site in water for 3 to 5 days.  Inspect the site at least twice daily.  Do not flex or bend the affected arm for 24 hours.  No lifting over 5 pounds (2.3 kg) for 5 days after your procedure.  Do not drive home if you are discharged the same day of the procedure. Have someone else drive you.  You may drive 24 hours after the procedure unless otherwise instructed by your caregiver.  What to expect: Any bruising will usually fade within 1 to 2 weeks.  Blood that collects in the tissue (hematoma) may be painful to the touch. It should usually decrease in size and tenderness within 1 to 2 weeks.  SEEK IMMEDIATE MEDICAL CARE IF: You have unusual pain at the radial site.  You have redness, warmth, swelling, or pain at the radial site.  You have drainage (other than a small amount of blood on the dressing).  You have chills.  You have a fever or persistent symptoms for more than 72 hours.  You have a fever and your symptoms suddenly get worse.   Your arm becomes pale, cool, tingly, or numb.  You have heavy bleeding from the site. Hold pressure on the site.   Increase activity slowly   Complete by: As directed        Discharge Medications Allergies as of 06/08/2024   No Known Allergies      Medication List     TAKE these medications    aspirin  EC 81 MG tablet Take 1 tablet (81 mg total) by mouth daily. Swallow whole.   atorvastatin  80 MG tablet Commonly known as: LIPITOR Take 1 tablet (80 mg total) by mouth daily.   dapagliflozin  propanediol 10 MG Tabs tablet Commonly known as: FARXIGA  Take 1 tablet (10 mg total) by mouth daily.   losartan  25 MG tablet Commonly known as: COZAAR  Take 0.5 tablets (12.5 mg total) by mouth daily.   metoprolol  succinate 25 MG 24 hr tablet Commonly known as: TOPROL -XL Take 1 tablet (25 mg total) by mouth daily.   multivitamin with minerals Tabs tablet Take 1 tablet by mouth daily.   nitroGLYCERIN  0.4 MG SL tablet Commonly known as: Nitrostat  Place 1 tablet (0.4 mg total) under the tongue every 5 (five) minutes as needed for chest pain.   Omega-3 1000 MG Caps Take 1 capsule by mouth daily at 12 noon.   ticagrelor  90 MG Tabs tablet Commonly known as: BRILINTA  Take 1 tablet (90 mg total) by mouth 2 (two) times daily.               Durable Medical Equipment  (From admission, onward)           Start     Ordered   06/06/24 1314  For home use only DME Vest life vest  Once       Comments: Length of need- 3 months   06/06/24 1313             Outstanding Labs/Studies BMP 2 weeks. Lipid 2 months.   Duration of Discharge Encounter: APP Time: 25 minutes   Signed, Thom LITTIE Sluder, PA-C 06/08/2024, 9:43 AM

## 2024-06-10 ENCOUNTER — Telehealth: Payer: Self-pay | Admitting: Internal Medicine

## 2024-06-10 NOTE — Telephone Encounter (Signed)
 Patient contacted our after hours cardiology line to discuss symptoms of diarrhea after being discharged from our hospital.  She states loose watery stools that's been frequent but denies any other symptoms such as abdominal pain, bloody stools, nausea, or vomiting.  Informed patient if she develops any additional symptoms such as nausea or vomiting, she will need to be seen in our emergency department for further evaluation.

## 2024-06-11 ENCOUNTER — Other Ambulatory Visit: Payer: Self-pay | Admitting: Cardiology

## 2024-06-13 ENCOUNTER — Ambulatory Visit (HOSPITAL_COMMUNITY)
Admit: 2024-06-13 | Discharge: 2024-06-13 | Disposition: A | Discharge status: Home / Self Care | Source: Ambulatory Visit | Attending: Physician Assistant | Admitting: Physician Assistant

## 2024-06-13 ENCOUNTER — Encounter (HOSPITAL_COMMUNITY): Payer: Self-pay

## 2024-06-13 ENCOUNTER — Telehealth (HOSPITAL_COMMUNITY): Payer: Self-pay | Admitting: Cardiology

## 2024-06-13 ENCOUNTER — Ambulatory Visit (HOSPITAL_COMMUNITY): Payer: Self-pay | Admitting: Physician Assistant

## 2024-06-13 VITALS — BP 116/70 | HR 80 | Ht 64.0 in | Wt 149.8 lb

## 2024-06-13 DIAGNOSIS — I2510 Atherosclerotic heart disease of native coronary artery without angina pectoris: Secondary | ICD-10-CM | POA: Diagnosis not present

## 2024-06-13 DIAGNOSIS — I252 Old myocardial infarction: Secondary | ICD-10-CM | POA: Insufficient documentation

## 2024-06-13 DIAGNOSIS — I5022 Chronic systolic (congestive) heart failure: Secondary | ICD-10-CM | POA: Insufficient documentation

## 2024-06-13 DIAGNOSIS — E7800 Pure hypercholesterolemia, unspecified: Secondary | ICD-10-CM | POA: Insufficient documentation

## 2024-06-13 DIAGNOSIS — I255 Ischemic cardiomyopathy: Secondary | ICD-10-CM | POA: Insufficient documentation

## 2024-06-13 DIAGNOSIS — E782 Mixed hyperlipidemia: Secondary | ICD-10-CM | POA: Diagnosis not present

## 2024-06-13 DIAGNOSIS — I4729 Other ventricular tachycardia: Secondary | ICD-10-CM | POA: Diagnosis not present

## 2024-06-13 LAB — BRAIN NATRIURETIC PEPTIDE: B Natriuretic Peptide: 293.5 pg/mL — ABNORMAL HIGH (ref 0.0–100.0)

## 2024-06-13 LAB — BASIC METABOLIC PANEL WITH GFR
Anion gap: 14 (ref 5–15)
BUN: 15 mg/dL (ref 6–20)
CO2: 21 mmol/L — ABNORMAL LOW (ref 22–32)
Calcium: 9.4 mg/dL (ref 8.9–10.3)
Chloride: 100 mmol/L (ref 98–111)
Creatinine, Ser: 0.64 mg/dL (ref 0.44–1.00)
GFR, Estimated: >60 mL/min (ref >60)
Glucose, Bld: 98 mg/dL (ref 70–99)
Potassium: 4.1 mmol/L (ref 3.5–5.1)
Sodium: 135 mmol/L (ref 135–145)

## 2024-06-13 MED ORDER — SPIRONOLACTONE 25 MG PO TABS: 25.0000 mg | ORAL_TABLET | Freq: Every day | ORAL | 3 refills | Status: DC

## 2024-06-13 MED ORDER — LOSARTAN POTASSIUM 25 MG PO TABS: 25.0000 mg | ORAL_TABLET | Freq: Every day | ORAL | 6 refills | Status: DC

## 2024-06-13 MED ORDER — SPIRONOLACTONE 25 MG PO TABS: 12.5000 mg | ORAL_TABLET | Freq: Every day | ORAL | 3 refills | Status: DC

## 2024-06-13 NOTE — Telephone Encounter (Signed)
 Patient called to report she was advised to increase losartan  at OV today to 25 mg however she was already taking the full tablet, are further medication changes needed?

## 2024-06-13 NOTE — Addendum Note (Signed)
 Addended by: JERONA DALTON HERO on: 06/13/2024 04:45 PM   Modules accepted: Orders

## 2024-06-13 NOTE — Progress Notes (Signed)
 HEART & VASCULAR TRANSITION OF CARE CONSULT NOTE     Referring Physician: Dr. Swaziland PCP: Rolinda Millman, MD  Cardiologist: Dr. Ladona  Chief Complaint: Chronic systolic CHF  HPI: Referred to clinic by Dr. Swaziland with [facility] for heart failure consultation.   Maria Zamora is a 63 y.o. female with history of no significant medical history other than hypercholesterolemia. Patient presented 06/05/24 with anterolateral STEMI. LHC with 70-80% tiny OM1, 40-50% OM2, occluded p LAD s/p PTCA/DES. Echo with EF 25-30%. GDMT titrated but limited by soft BP. Had runs of NSVT post reperfusion and was fitted for LifeVest.   She is here today for post hospital CHF follow-up. Patient is accompanied by her daughter and husband who assist with the history. She has been doing well since discharge. No recurrent angina. Notes a little bit of shortness of breath with heavier activity such as climbing stairs. She has been hesitant to exert herself until she starts cardiac rehab. No orthopnea, PND, or lower extremity edema. She is taking all medications as prescribed and has no issues tolerating them. Home BP typically averaging 110s/60s-70s. No discharges from LifeVest.  She is a retired Manufacturing systems engineer. No ETOH, tobacco or drug use.  Has strong family history of CAD on father's side.     Past Medical History:  Diagnosis Date   Hyperlipidemia     Current Outpatient Medications  Medication Sig Dispense Refill   aspirin  EC 81 MG tablet Take 1 tablet (81 mg total) by mouth daily. Swallow whole. 120 tablet 2   atorvastatin  (LIPITOR ) 80 MG tablet Take 1 tablet (80 mg total) by mouth daily. 30 tablet 11   cetirizine (ZYRTEC) 10 MG tablet Take 10 mg by mouth daily as needed for allergies (seasonal).     dapagliflozin  propanediol (FARXIGA ) 10 MG TABS tablet Take 1 tablet (10 mg total) by mouth daily. 30 tablet 11   fexofenadine (ALLEGRA) 180 MG tablet Take 180 mg by mouth daily as needed for  allergies or rhinitis (Seasonal).     metoprolol  succinate (TOPROL -XL) 25 MG 24 hr tablet Take 1 tablet (25 mg total) by mouth daily. 30 tablet 6   Multiple Vitamin (MULTIVITAMIN WITH MINERALS) TABS tablet Take 1 tablet by mouth daily.     nitroGLYCERIN  (NITROSTAT ) 0.4 MG SL tablet Place 1 tablet (0.4 mg total) under the tongue every 5 (five) minutes as needed for chest pain. 25 tablet 3   Omega-3 1000 MG CAPS Take 1 capsule by mouth daily at 12 noon.     spironolactone  (ALDACTONE ) 25 MG tablet Take 0.5 tablets (12.5 mg total) by mouth daily. 90 tablet 3   ticagrelor  (BRILINTA ) 90 MG TABS tablet Take 1 tablet (90 mg total) by mouth 2 (two) times daily. 60 tablet 11   losartan  (COZAAR ) 25 MG tablet Take 1 tablet (25 mg total) by mouth daily. 30 tablet 6   No current facility-administered medications for this encounter.    No Known Allergies    Social History   Socioeconomic History   Marital status: Married    Spouse name: Marcey   Number of children: 1   Years of education: Not on file   Highest education level: Bachelor's degree (e.g., BA, AB, BS)  Occupational History   Occupation: Retired  Tobacco Use   Smoking status: Never   Smokeless tobacco: Never  Vaping Use   Vaping status: Never Used  Substance and Sexual Activity   Alcohol use: Yes    Alcohol/week: 5.0 -  7.0 standard drinks of alcohol    Types: 5 - 7 Standard drinks or equivalent per week    Comment: weekly   Drug use: No   Sexual activity: Yes    Partners: Male    Birth control/protection: Post-menopausal  Other Topics Concern   Not on file  Social History Narrative   Not on file   Social Drivers of Health   Financial Resource Strain: Low Risk  (06/07/2024)   Overall Financial Resource Strain (CARDIA)    Difficulty of Paying Living Expenses: Not very hard  Food Insecurity: No Food Insecurity (06/06/2024)   Hunger Vital Sign    Worried About Running Out of Food in the Last Year: Never true    Ran Out of  Food in the Last Year: Never true  Transportation Needs: No Transportation Needs (06/06/2024)   PRAPARE - Administrator, Civil Service (Medical): No    Lack of Transportation (Non-Medical): No  Physical Activity: Not on file  Stress: Not on file  Social Connections: Unknown (06/06/2024)   Social Connection and Isolation Panel    Frequency of Communication with Friends and Family: Not on file    Frequency of Social Gatherings with Friends and Family: Not on file    Attends Religious Services: Not on file    Active Member of Clubs or Organizations: Not on file    Attends Banker Meetings: Not on file    Marital Status: Married  Intimate Partner Violence: Not At Risk (06/06/2024)   Humiliation, Afraid, Rape, and Kick questionnaire    Fear of Current or Ex-Partner: No    Emotionally Abused: No    Physically Abused: No    Sexually Abused: No      Family History  Problem Relation Age of Onset   Heart disease Paternal Grandmother    Heart disease Paternal Grandfather    Hypertension Father    Cancer Maternal Uncle        prostate   Cancer Maternal Grandfather        prostate    Vitals:   06/13/24 0935  BP: 116/70  Pulse: 80  SpO2: 97%  Weight: 67.9 kg (149 lb 12.8 oz)  Height: 5' 4 (1.626 m)    PHYSICAL EXAM: General:  Well appearing.  Cor: No JVD. Regular rate & rhythm. No murmurs. Wearing LifeVest. Lungs: clear Abdomen: soft, nontender, nondistended.  Extremities: no edema Neuro: alert & oriented x 3. Affect pleasant.  ECG: SR 77 bpm, persistent ST elevation anterolateral leads, anterior and lateral Q waves   ASSESSMENT & PLAN: Chronic systolic CHF/ICM: -EF 25-30% LV gram at time of LHC 06/05/24 -Echo 06/06/24: LVEF read as 40-45% (EF at most 35% on review by Dr. Rolan), WMA in LAD territory -NYHA I-II. Volume looks good. Not requiring loop diuretic -Increase losartan  to 25 mg daily (if tolerated would eventually transition to  Entresto) -Start spiro 12.5 mg daily -Continue metoprolol  xl 25 mg daily -Continue Farxiga  10 mg daily -BMET/BNP today, BMET again in 1 week -No arrhythmias on personal interrogation of LifeVest in clinic today -Repeat echo in 3 months to reassess LV function. If EF remains < or = 35% will need referral to EP for consideration of primary prevention ICD.  2. CAD Recent anterior STEMI -LHC 07/25 with 70-80% tiny OM1, 40-50% OM2, occluded p LAD s/p PTCA/DES -Continue ASA + brilinta  X 1 year -On high-intensity statin -No angina. Doing well. -Encouraged participation in stage II cardiac rehab  3. HLD -  LDL 133 07/25 -Now on 80 mg atorvastatin  daily. Lipid panel 6-8 weeks. Goal LDL < 55 mg/dL. May need to refer to lipid clinic for consideration of PCSK-9 Inhibitor.  4. NSVT -Noted post reperfusion -Now wearing LifeVest. No ventricular arrhythmias on interrogation today.    Referred to HFSW (PCP, Medications, Transportation, ETOH Abuse, Drug Abuse, Insurance, Financial ):No Refer to Pharmacy: No Refer to Home Health: No Refer to Advanced [facility] Failure Clinic: Yes Refer to General Cardiology: No, already established  Follow up  1 week labs, 3 weeks Dr. Rolan to establish. Will follow along with Cardiology.

## 2024-06-13 NOTE — Patient Instructions (Addendum)
 Good to see you today!  INCREASE Losartan  to 25 mg( 1 tablet) daily  START Spironolactone  12.5 mg (1/2) daily  Labs done today, your results will be available in MyChart, we will contact you for abnormal readings.  Repeat lab work  in 1 week  as scheduled  Your physician recommends that you schedule a follow-up appointment a scheduled  If you have any questions or concerns before your next appointment please send us  a message through [place] or call our office at [phone].    TO LEAVE A MESSAGE FOR THE NURSE SELECT OPTION 2, PLEASE LEAVE A MESSAGE INCLUDING: YOUR NAME DATE OF BIRTH CALL BACK NUMBER REASON FOR CALL**this is important as we prioritize the call backs  YOU WILL RECEIVE A CALL BACK THE SAME DAY AS LONG AS YOU CALL BEFORE 4:00 PM At the [facility], you and your health needs are our priority. As part of our continuing mission to provide you with exceptional heart care, we have created designated Provider Care Teams. These Care Teams include your primary Cardiologist (physician) and Advanced Practice Providers (APPs- Physician Assistants and Nurse Practitioners) who all work together to provide you with the care you need, when you need it.   You may see any of the following providers on your designated Care Team at your next follow up: Dr Toribio Fuel Dr Ezra Shuck Dr. Ria Commander Dr. Morene Brownie Amy Lenetta, NP Caffie Shed, [place] [facility] [place], [place] Beckey Coe, NP [place] Lee, NP Ellouise Class, NP Tinnie Redman, PharmD Jaun Bash, PharmD   Please be sure to bring in all your medications bottles to every appointment.    Thank you for choosing [facility]

## 2024-06-13 NOTE — Telephone Encounter (Signed)
 If she was already doing a full tablet of losartan  lets have her take a full tablet of spironolactone  daily instead of half tablet that we dicussed today.

## 2024-06-13 NOTE — Telephone Encounter (Signed)
Pt aware and voiced understanding Med list updated

## 2024-06-17 ENCOUNTER — Other Ambulatory Visit: Payer: Self-pay

## 2024-06-17 ENCOUNTER — Encounter (HOSPITAL_COMMUNITY): Payer: Self-pay | Admitting: *Deleted

## 2024-06-17 ENCOUNTER — Emergency Department (HOSPITAL_COMMUNITY)
Admission: EM | Admit: 2024-06-17 | Discharge: 2024-06-17 | Disposition: A | Discharge status: Home / Self Care | Attending: Emergency Medicine | Admitting: Emergency Medicine

## 2024-06-17 ENCOUNTER — Emergency Department (HOSPITAL_COMMUNITY)

## 2024-06-17 ENCOUNTER — Telehealth: Payer: Self-pay | Admitting: Physician Assistant

## 2024-06-17 DIAGNOSIS — Z79899 Other long term (current) drug therapy: Secondary | ICD-10-CM | POA: Diagnosis not present

## 2024-06-17 DIAGNOSIS — R0789 Other chest pain: Secondary | ICD-10-CM | POA: Diagnosis not present

## 2024-06-17 DIAGNOSIS — R079 Chest pain, unspecified: Secondary | ICD-10-CM | POA: Diagnosis not present

## 2024-06-17 DIAGNOSIS — Z7982 Long term (current) use of aspirin: Secondary | ICD-10-CM | POA: Insufficient documentation

## 2024-06-17 DIAGNOSIS — Z8679 Personal history of other diseases of the circulatory system: Secondary | ICD-10-CM | POA: Diagnosis not present

## 2024-06-17 DIAGNOSIS — I252 Old myocardial infarction: Secondary | ICD-10-CM

## 2024-06-17 HISTORY — DX: Acute myocardial infarction, unspecified: I21.9

## 2024-06-17 HISTORY — DX: Atherosclerotic heart disease of native coronary artery without angina pectoris: I25.10

## 2024-06-17 LAB — CBC
HCT: 45 % (ref 36.0–46.0)
Hemoglobin: 14.5 g/dL (ref 12.0–15.0)
MCH: 29.8 pg (ref 26.0–34.0)
MCHC: 32.2 g/dL (ref 30.0–36.0)
MCV: 92.4 fL (ref 80.0–100.0)
Platelets: 344 K/uL (ref 150–400)
RBC: 4.87 MIL/uL (ref 3.87–5.11)
RDW: 12.1 % (ref 11.5–15.5)
WBC: 11.5 K/uL — ABNORMAL HIGH (ref 4.0–10.5)
nRBC: 0 % (ref 0.0–0.2)

## 2024-06-17 LAB — BASIC METABOLIC PANEL WITH GFR
Anion gap: 10 (ref 5–15)
BUN: 18 mg/dL (ref 6–20)
CO2: 24 mmol/L (ref 22–32)
Calcium: 9.3 mg/dL (ref 8.9–10.3)
Chloride: 102 mmol/L (ref 98–111)
Creatinine, Ser: 0.8 mg/dL (ref 0.44–1.00)
GFR, Estimated: >60 mL/min (ref >60)
Glucose, Bld: 113 mg/dL — ABNORMAL HIGH (ref 70–99)
Potassium: 4.1 mmol/L (ref 3.5–5.1)
Sodium: 136 mmol/L (ref 135–145)

## 2024-06-17 LAB — I-STAT CHEM 8, ED
BUN: 20 mg/dL (ref 6–20)
Calcium, Ion: 1.12 mmol/L — ABNORMAL LOW (ref 1.15–1.40)
Chloride: 102 mmol/L (ref 98–111)
Creatinine, Ser: 0.8 mg/dL (ref 0.44–1.00)
Glucose, Bld: 113 mg/dL — ABNORMAL HIGH (ref 70–99)
HCT: 44 % (ref 36.0–46.0)
Hemoglobin: 15 g/dL (ref 12.0–15.0)
Potassium: 4 mmol/L (ref 3.5–5.1)
Sodium: 136 mmol/L (ref 135–145)
TCO2: 26 mmol/L (ref 22–32)

## 2024-06-17 LAB — TROPONIN I (HIGH SENSITIVITY)
Troponin I (High Sensitivity): 31 ng/L — ABNORMAL HIGH (ref <18)
Troponin I (High Sensitivity): 35 ng/L — ABNORMAL HIGH (ref <18)

## 2024-06-17 MED ORDER — ASPIRIN 81 MG PO CHEW
243.0000 mg | CHEWABLE_TABLET | Freq: Once | ORAL | Status: AC
  Administered 2024-06-17: 243 mg via ORAL
  Filled 2024-06-17: qty 3

## 2024-06-17 MED ORDER — SODIUM CHLORIDE 0.9 % IV SOLN: INTRAVENOUS | Status: DC

## 2024-06-17 NOTE — ED Notes (Signed)
 Called to cancel stemi per dr yolande

## 2024-06-17 NOTE — ED Notes (Signed)
 Patient was walked from his room to the restroom down the hall in yellow. Patient stated that he didn't not have SOB or dizziness

## 2024-06-17 NOTE — ED Triage Notes (Signed)
 The pt had a 2 minute  flash  of lt upper chest chest pain just before she arrived  here today she had a life vest  placed last week  no pain now

## 2024-06-17 NOTE — ED Provider Notes (Signed)
 [place] EMERGENCY DEPARTMENT AT [facility] Provider Note   CSN: [id number] Arrival date & time: 06/17/24  1538     Patient presents with: Chest Pain   Maria Zamora is a 63 y.o. female.   63 year old female with recent STEMI status post PCI in 06/05/2024 who presents emergency department chest discomfort.  Patient reports that she was at home approximately an hour prior to arrival when she had 2 minutes of burning in the left side of her chest.  Says it felt like an IcyHot sensation.  No additional chest pain.  Did get up and walk around but reports the pain did not worsen.  Did have an episode of sweating when she was on the phone but thinks this was from anxiety.  No vomiting.  No radiation of the pain.  Currently is asymptomatic.  Has been compliant with her aspirin  and Brilinta .       Prior to Admission medications   Medication Sig Start Date End Date Taking? Authorizing Provider  aspirin  EC 81 MG tablet Take 1 tablet (81 mg total) by mouth daily. Swallow whole. 06/08/24   Darryle Thom CROME, PA-C  atorvastatin  (LIPITOR ) 80 MG tablet Take 1 tablet (80 mg total) by mouth daily. 06/08/24   Darryle Thom CROME, PA-C  cetirizine (ZYRTEC) 10 MG tablet Take 10 mg by mouth daily as needed for allergies (seasonal).    [provider]  dapagliflozin  propanediol (FARXIGA ) 10 MG TABS tablet Take 1 tablet (10 mg total) by mouth daily. 06/08/24   Darryle Thom CROME, PA-C  fexofenadine (ALLEGRA) 180 MG tablet Take 180 mg by mouth daily as needed for allergies or rhinitis (Seasonal).    [provider]  losartan  (COZAAR ) 25 MG tablet Take 1 tablet (25 mg total) by mouth daily. 06/13/24   Colletta Manuelita Garre, PA-C  metoprolol  succinate (TOPROL -XL) 25 MG 24 hr tablet Take 1 tablet (25 mg total) by mouth daily. 06/08/24   Darryle Thom CROME, PA-C  Multiple Vitamin (MULTIVITAMIN WITH MINERALS) TABS tablet Take 1 tablet by mouth daily.    [provider]  nitroGLYCERIN   (NITROSTAT ) 0.4 MG SL tablet Place 1 tablet (0.4 mg total) under the tongue every 5 (five) minutes as needed for chest pain. 06/08/24 06/08/25  Darryle Thom CROME, PA-C  Omega-3 1000 MG CAPS Take 1 capsule by mouth daily at 12 noon.    [provider]  spironolactone  (ALDACTONE ) 25 MG tablet Take 1 tablet (25 mg total) by mouth daily. 06/13/24 09/11/24  Colletta Manuelita Garre, PA-C  ticagrelor  (BRILINTA ) 90 MG TABS tablet Take 1 tablet (90 mg total) by mouth 2 (two) times daily. 06/08/24   Darryle Thom CROME, PA-C    Allergies: Patient has no known allergies.    Review of Systems  Updated Vital Signs BP 122/74 (BP Location: Left Arm)   Pulse 83   Temp 98.3 F (36.8 C) (Oral)   Resp 15   Ht 5' 4 (1.626 m)   Wt 67.9 kg   LMP 11/18/2015 (Exact Date)   SpO2 100%   BMI 25.69 kg/m   Physical Exam Vitals and nursing note reviewed.  Constitutional:      General: She is not in acute distress.    Appearance: She is well-developed.  HENT:     Head: Normocephalic and atraumatic.     Right Ear: External ear normal.     Left Ear: External ear normal.     Nose: Nose normal.  Eyes:  Extraocular Movements: Extraocular movements intact.     Conjunctiva/sclera: Conjunctivae normal.     Pupils: Pupils are equal, round, and reactive to light.  Cardiovascular:     Rate and Rhythm: Normal rate and regular rhythm.     Heart sounds: No murmur heard. Pulmonary:     Effort: Pulmonary effort is normal. No respiratory distress.     Breath sounds: Normal breath sounds.  Musculoskeletal:     Cervical back: Normal range of motion and neck supple.     Right lower leg: No edema.     Left lower leg: No edema.  Skin:    General: Skin is warm and dry.  Neurological:     Mental Status: She is alert and oriented to person, place, and time. Mental status is at baseline.  Psychiatric:        Mood and Affect: Mood normal.     (all labs ordered are listed, but only abnormal results are displayed) Labs  Reviewed  BASIC METABOLIC PANEL WITH GFR - Abnormal; Notable for the following components:      Result Value   Glucose, Bld 113 (*)    All other components within normal limits  CBC - Abnormal; Notable for the following components:   WBC 11.5 (*)    All other components within normal limits  I-STAT CHEM 8, ED - Abnormal; Notable for the following components:   Glucose, Bld 113 (*)    Calcium , Ion 1.12 (*)    All other components within normal limits  TROPONIN I (HIGH SENSITIVITY) - Abnormal; Notable for the following components:   Troponin I (High Sensitivity) 31 (*)    All other components within normal limits  TROPONIN I (HIGH SENSITIVITY) - Abnormal; Notable for the following components:   Troponin I (High Sensitivity) 35 (*)    All other components within normal limits    EKG: EKG Interpretation Date/Time:  Sunday June 17 2024 16:24:28 EDT Ventricular Rate:  96 PR Interval:  125 QRS Duration:  108 QT Interval:  326 QTC Calculation: 412 R Axis:   222  Text Interpretation: Sinus rhythm Consider right atrial enlargement Abnormal lateral Q waves Probable anteroseptal infarct, recent Confirmed by Yolande Charleston [phone]) on 06/17/2024 4:37:10 PM  Radiology: DG Chest Portable 1 View Result Date: 06/17/2024 CLINICAL DATA:  Chest pain. EXAM: PORTABLE CHEST 1 VIEW COMPARISON:  Chest radiograph dated 06/05/2024. FINDINGS: No focal consolidation, pleural effusion, pneumothorax the cardiac silhouette is within normal limits. No acute osseous pathology. IMPRESSION: No active disease. Electronically Signed   By: Vanetta Chou M.D.   On: 06/17/2024 17:03     Procedures   Medications Ordered in the ED  0.9 %  sodium chloride  infusion (0 mLs Intravenous Hold 06/17/24 1650)  aspirin  chewable tablet 243 mg (243 mg Oral Given 06/17/24 1620)    Clinical Course as of 06/17/24 1953  Sun Jun 17, 2024  1631 Dr Burton from cardiology at the bedside.  Feel that we can cancel code STEMI at this  point in time [RP]  1649 Dr. Vernice recommend serial troponins.  If WNL patient likely can be discharged with outpatient follow-up. [RP]  1930 Dr Almetta from cardiology updated regarding the patient's troponins.  Feels that patient still suitable for outpatient workup. [RP]    Clinical Course User Index [RP] Yolande Charleston BROCKS, MD  Medical Decision Making Amount and/or Complexity of Data Reviewed Labs: ordered. Radiology: ordered.  Risk OTC drugs. Prescription drug management.   PENI RUPARD is a 63 y.o. female with comorbidities that complicate the patient evaluation including recent STEMI status post PCI on 06/05/2024 who presented to the emergency department with transient chest discomfort  Initial Ddx:  MI, pericarditis, musculoskeletal pain, PE  MDM/Course:  Patient presents emergency department with a strange feeling in her chest.  Lasted approximately 2 minutes and spontaneously resolved.  Describes it as a burning sensation.  Was not exertional but did have some diaphoresis with it.  No shortness of breath or respiratory symptoms at all.  She is overall well-appearing on exam and says that she is totally and symptomatic.  EKG was obtained which did show some ST elevations which appear to be improved from prior.  I did attempt to contact the on-call STEMI cardiologist multiple times but was unable to reach him so I did activate code STEMI.  Dr. Vernice from cardiology came down to evaluate the patient and felt that STEMI was less likely and so code STEMI was canceled.  She had serial troponins that were 33 and 35 respectively.  Her EKG did not show any new changes when repeated.  She is feeling very well and wanting to go home at this time.  Did discuss again with cardiology after her troponins returned who felt that outpatient follow-up was reasonable.  Return precautions discussed prior to discharge.   This patient presents to the ED for  concern of complaints listed in HPI, this involves an extensive number of treatment options, and is a complaint that carries with it a high risk of complications and morbidity. Disposition including potential need for admission considered.   Dispo: DC Home. Return precautions discussed including, but not limited to, those listed in the AVS. Allowed pt time to ask questions which were answered fully prior to dc.  Additional history obtained from spouse Records reviewed DC Summary The following labs were independently interpreted: Chemistry and show no acute abnormality I independently reviewed the following imaging with scope of interpretation limited to determining acute life threatening conditions related to emergency care: Chest x-ray and agree with the radiologist interpretation with the following exceptions: none I personally reviewed and interpreted cardiac monitoring: normal sinus rhythm  I personally reviewed and interpreted the pt's EKG: see above for interpretation  I have reviewed the patients home medications and made adjustments as needed Consults: Cardiology  Portions of this note were generated with Dragon dictation software. Dictation errors may occur despite best attempts at proofreading.     Final diagnoses:  Chest pain, unspecified type  History of ST elevation myocardial infarction (STEMI)    ED Discharge Orders     None          Yolande Lamar BROCKS, MD 06/17/24 [phone]

## 2024-06-17 NOTE — ED Notes (Addendum)
 Code STEMI cancelled by [facility] Cardiology and EDP MD Yolande.

## 2024-06-17 NOTE — ED Triage Notes (Signed)
 Mi 10 days ago

## 2024-06-17 NOTE — Telephone Encounter (Signed)
   The patient called the answering service after-hours today. She had recent STEMI, PCI, low EF, NSVT, Lifevest. This afternoon she suddenly developed chest burning without provocation for several minutes, no palpitations or SOB. She is unsure if this represents a new problem or medication side effect, seeking reassurance for symptoms. Discussed that unfortunately chest pain is not something we can easily discredit or reassure over the phone, that given cardiac history if new/worsening/recurrent symptoms, warrants return to ED for formal eval. The patient agrees and plans to proceed to ED for eval. Patient declines EMS, plans to have husband bring her. Routing to Dr. Ganji just as FYI.  Maria Zamora N Maria Conrow, PA-C

## 2024-06-17 NOTE — Consult Note (Signed)
 Cardiology Consultation:  Patient ID: Maria Zamora MRN: [id number]; DOB: [birth year]  Admit date: 06/17/2024 Date of Consult: 06/17/2024  Primary Care Provider: Rolinda Millman, MD Primary Cardiologist: Gordy Bergamo, MD  Primary Electrophysiologist:  None   Patient Profile:  Maria Zamora is a 63 y.o. female with a hx of anterior STEMI, systolic HF, HLD who is being seen today for the evaluation of chest pain at the request of Lamar Gander, MD.  History of Present Illness:  Ms. Beshears presents with acute chest discomfort.  She tells me she was sitting around her house this afternoon and developed burning sensation.  She tells me it is like IcyHot on her chest.  Symptoms lasted seconds to minutes and resolve without intervention.  She did not take nitroglycerin .  She then called the on-call service and presented for evaluation.  She has had no further chest discomfort.  Upon arrival to the ER she was noted to have anterior Q waves with persistent ST elevation which is similar to her prior EKG tracing of the day of 06/13/2024.  She was recently admitted to the hospital for anterior STEMI complicated by acute systolic heart failure with ejection fraction 25 to 30%.  She was discharged home with LifeVest.  She informs me she has not missed any medications.  On arrival to the ER she has had no further chest discomfort.  She feels well.  She is euvolemic on exam.  Labs are pending.  Past Medical History: Past Medical History:  Diagnosis Date   Coronary artery disease    Hyperlipidemia    MI (myocardial infarction) (HCC)     Past Surgical History: Past Surgical History:  Procedure Laterality Date   CORONARY/GRAFT ACUTE MI REVASCULARIZATION N/A 06/05/2024   Procedure: Coronary/Graft Acute MI Revascularization;  Surgeon: Bergamo Gordy, MD;  Location: [facility] INVASIVE CV LAB;  Service: Cardiovascular;  Laterality: N/A;   LEFT HEART CATH AND CORONARY ANGIOGRAPHY N/A 06/05/2024   Procedure: LEFT  HEART CATH AND CORONARY ANGIOGRAPHY;  Surgeon: Bergamo Gordy, MD;  Location: [facility] INVASIVE CV LAB;  Service: Cardiovascular;  Laterality: N/A;   PERCUTANEOUS FIXATION TIBIAL SHAFT FRACTURE W/ PINS / SCREWS Left 01/17/2023   Left middle finger   RETINAL DETACHMENT SURGERY  11/22/2009   TONSILLECTOMY AND ADENOIDECTOMY       Allergies:    No Known Allergies  Social History:   Social History   Socioeconomic History   Marital status: Married    Spouse name: Marcey   Number of children: 1   Years of education: Not on file   Highest education level: Bachelor's degree (e.g., BA, AB, BS)  Occupational History   Occupation: Retired  Tobacco Use   Smoking status: Never   Smokeless tobacco: Never  Vaping Use   Vaping status: Never Used  Substance and Sexual Activity   Alcohol use: Yes    Alcohol/week: 5.0 - 7.0 standard drinks of alcohol    Types: 5 - 7 Standard drinks or equivalent per week    Comment: weekly   Drug use: No   Sexual activity: Yes    Partners: Male    Birth control/protection: Post-menopausal  Other Topics Concern   Not on file  Social History Narrative   Not on file   Social Drivers of Health   Financial Resource Strain: Low Risk  (06/07/2024)   Overall Financial Resource Strain (CARDIA)    Difficulty of Paying Living Expenses: Not very hard  Food Insecurity: No Food Insecurity (06/06/2024)   Hunger Vital  Sign    Worried About Programme researcher, broadcasting/film/video in the Last Year: Never true    Ran Out of Food in the Last Year: Never true  Transportation Needs: No Transportation Needs (06/06/2024)   PRAPARE - Administrator, Civil Service (Medical): No    Lack of Transportation (Non-Medical): No  Physical Activity: Not on file  Stress: Not on file  Social Connections: Unknown (06/06/2024)   Social Connection and Isolation Panel    Frequency of Communication with Friends and Family: Not on file    Frequency of Social Gatherings with Friends and Family: Not on file     Attends Religious Services: Not on file    Active Member of Clubs or Organizations: Not on file    Attends Banker Meetings: Not on file    Marital Status: Married  Intimate Partner Violence: Not At Risk (06/06/2024)   Humiliation, Afraid, Rape, and Kick questionnaire    Fear of Current or Ex-Partner: No    Emotionally Abused: No    Physically Abused: No    Sexually Abused: No     Family History:    Family History  Problem Relation Age of Onset   Heart disease Paternal Grandmother    Heart disease Paternal Grandfather    Hypertension Father    Cancer Maternal Uncle        prostate   Cancer Maternal Grandfather        prostate     ROS:  All other ROS reviewed and negative. Pertinent positives noted in the HPI.     Physical Exam/Data:   Vitals:   06/17/24 1554 06/17/24 1559 06/17/24 1630  BP: 132/87  (!) 143/91  Pulse: 86  95  Resp: 17  19  Temp: 98.5 F (36.9 C)    SpO2: 97%  100%  Weight:  67.9 kg   Height:  5' 4 (1.626 m)    No intake or output data in the 24 hours ending 06/17/24 1643     06/17/2024    3:59 PM 06/13/2024    9:35 AM 06/08/2024    4:54 AM  Last 3 Weights  Weight (lbs) 149 lb 11.1 oz 149 lb 12.8 oz 151 lb 4.8 oz  Weight (kg) 67.9 kg 67.949 kg 68.629 kg    Body mass index is 25.69 kg/m.  General: Well nourished, well developed, in no acute distress Head: Atraumatic, normal size  Eyes: PEERLA, EOMI  Neck: Supple, no JVD Endocrine: No thryomegaly Cardiac: Normal S1, S2; RRR; no murmurs, rubs, or gallops Lungs: Clear to auscultation bilaterally, no wheezing, rhonchi or rales  Abd: Soft, nontender, no hepatomegaly  Ext: No edema, pulses 2+ Musculoskeletal: No deformities, BUE and BLE strength normal and equal Skin: Warm and dry, no rashes   Neuro: Alert and oriented to person, place, time, and situation, CNII-XII grossly intact, no focal deficits  Psych: Normal mood and affect   EKG:  The EKG was personally reviewed and  demonstrates:  NSR 96 bpm, anterior Q waves with ST elevation, unchanged from prior  Telemetry:  Telemetry was personally reviewed and demonstrates:  SR 90s  Relevant CV Studies: TTE 06/06/2024  1. Left ventricular ejection fraction, by estimation, is 40 to 45%. The  left ventricle has mildly decreased function. The left ventricle  demonstrates regional wall motion abnormalities (see scoring  diagram/findings for description). There is mild  concentric left ventricular hypertrophy. Left ventricular diastolic  parameters are indeterminate.   2. Right ventricular systolic function  is normal. The right ventricular  size is normal. Tricuspid regurgitation signal is inadequate for assessing  PA pressure.   3. The mitral valve is normal in structure. Mild mitral valve  regurgitation. No evidence of mitral stenosis.   4. The aortic valve is normal in structure. Aortic valve regurgitation is  not visualized. No aortic stenosis is present.   5. The inferior vena cava is normal in size with greater than 50%  respiratory variability, suggesting right atrial pressure of 3 mmHg.   LHC 06/06/2024 Angiographic data: LV: Anterior, anterolateral and apical akinesis with EF 25 to 30%. LM: Large-caliber vessel.  Smooth and normal. LCx: It is a moderate caliber vessel and tortuous.  Proximal to mid CX is mild to moderately diffusely diseased with a very tiny OM1 with ostial 70 to 80% stenosis and an OM 2 about 40 to 50% stenosis which is large and mild disease in OM 3. LAD: It is a large vessel, occluded in the proximal segment.  Gives origin to a large D1 which has secondary branches.  There is mild disease in the proximal and mid LAD. RCA: Very large caliber vessel, gives origin to large PDA and PL branch, smooth and normal.   Assessment and Plan:   # Acute Chest Pain # History of anterior STEMI status post PCI to proximal LAD 06/06/2024 # Chronic systolic heart failure, ischemic, 25-30% - Presents  with chest discomfort which is not similar to prior discomfort in the setting of acute STEMI.  Symptoms have resolved.  She describes no further chest discomfort. 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  I reviewed her EKG from her outpatient visit on 06/13/2024 and it is unchanged. - Suspect her symptoms are noncardiac.  Discussed it with the ER.  Would recommend to trend troponins.  If negative x 2 she can be discharged.  She seems to be quite stable.  Will continue current heart failure regimen as well.  Her EKG is inconsistent with an acute STEMI, and more consistent with her known large anterior infarction. - If troponins are abnormal or trend up please have the on-call cardiology team reevaluate.  However everything is quite reassuring on examination and with available data.   Cardiology to sign off at this time. Please call with questions.   For questions or updates, please contact [facility] Please consult [email or web address] for contact info under     Signed, Darryle T. Barbaraann, MD, [facility] [place]  [facility]  06/17/2024 4:43 PM

## 2024-06-17 NOTE — ED Notes (Signed)
 Called to activate a code stemi per dr yolande

## 2024-06-17 NOTE — Discharge Instructions (Signed)
 You were seen for your chest pain in the emergency department.   At home, please take your nitroglycerin  if you have any more pain.    Follow-up with your primary doctor in 2-3 days regarding your visit.  Follow-up with cardiology on the 18th as scheduled.  Return immediately to the emergency department if you experience any of the following: Worsening pain, difficulty breathing, unexplained vomiting or sweating, or any other concerning symptoms.    Thank you for visiting our Emergency Department. It was a pleasure taking care of you today.

## 2024-06-17 NOTE — ED Notes (Addendum)
 Per MD Yolande STEMI called for patient.  Pt placed on radiolucent pads and zoll.

## 2024-06-19 ENCOUNTER — Encounter: Payer: Self-pay | Admitting: Cardiology

## 2024-06-19 ENCOUNTER — Ambulatory Visit: Attending: Cardiology | Admitting: Cardiology

## 2024-06-19 ENCOUNTER — Other Ambulatory Visit (HOSPITAL_COMMUNITY): Payer: Self-pay

## 2024-06-19 ENCOUNTER — Other Ambulatory Visit: Payer: Self-pay | Admitting: *Deleted

## 2024-06-19 VITALS — BP 123/77 | HR 76 | Resp 16 | Ht 64.0 in | Wt 150.0 lb

## 2024-06-19 DIAGNOSIS — I2510 Atherosclerotic heart disease of native coronary artery without angina pectoris: Secondary | ICD-10-CM | POA: Diagnosis not present

## 2024-06-19 DIAGNOSIS — E7800 Pure hypercholesterolemia, unspecified: Secondary | ICD-10-CM | POA: Diagnosis not present

## 2024-06-19 DIAGNOSIS — I5022 Chronic systolic (congestive) heart failure: Secondary | ICD-10-CM | POA: Diagnosis not present

## 2024-06-19 DIAGNOSIS — I255 Ischemic cardiomyopathy: Secondary | ICD-10-CM | POA: Diagnosis not present

## 2024-06-19 MED ORDER — SACUBITRIL-VALSARTAN 49-51 MG PO TABS
1.0000 | ORAL_TABLET | Freq: Two times a day (BID) | ORAL | 2 refills | Status: DC
  Filled 2024-06-19: qty 60, 30d supply, fill #0

## 2024-06-19 NOTE — Progress Notes (Signed)
 Cardiology Office Note:  .   Date:  06/19/2024  ID:  Maria Zamora, DOB 03/11/1961, MRN [id number] PCP: Rolinda Millman, MD  [facility] Providers Cardiologist:  Gordy Bergamo, MD   History of Present Illness: .   Maria Zamora is a 63 y.o. Caucasian female patient with hypercholesterolemia not on therapy, admitted on 06/05/2024 with acute anterolateral STEMI and underwent successful angioplasty to the LAD.  She had significant drop in her LVEF to 25 to 30%.  She was discharged home on 06/08/2024 with LifeVest on board due to NSVT episodes while on telemetry.  She presented back on 06/17/2024 with chest pain, was evaluated by cardiology and discharged home and symptoms of flushing and chest discomfort but felt to be due to Brilinta .  She now presents for follow-up, now tolerating all her medications well and she has no specific complaints.  She was thankful.  Discussed the use of AI scribe software for clinical note transcription with the patient, who gave verbal consent to proceed.  History of Present Illness Maria Zamora is a 63 year old female with a history of a large anterior MI who presents for follow-up regarding her heart function and medication management. She is accompanied by her husband.  She experienced a recent episode of flushing, which she attributes to Brilinta . During an emergency room visit, her troponin levels were stable at thirty. She is currently wearing a vest as a precautionary measure due to NSVT noted on telemetry during recovery post PTCA.  She monitors her blood pressure regularly without issues of dizziness or hypotension. Her family history is significant for heart disease, with her father having had a heart attack in his seventies and both paternal grandparents dying of heart attacks. Her paternal grandmother died around 50 or sixty.  Previously had hypercholesterolemia with LDL around 140-150, she has been on fish oil and is hesitant to start  cholesterol medication.  Now tolerating all her medications well including high intensity statins.  Remains asymptomatic.  Labs   Lab Results  Component Value Date   CHOL 225 (H) 06/05/2024   HDL 47 06/05/2024   LDLCALC 133 (H) 06/05/2024   TRIG 225 (H) 06/05/2024   CHOLHDL 4.8 06/05/2024   Lipoprotein (a)  Date/Time Value Ref Range Status  06/05/2024 11:41 PM 11.4 <75.0 nmol/L Final    Comment:    (NOTE) This test was developed and its performance characteristics determined by [facility]. It has not been cleared or approved by the Food and Drug Administration. Note:  Values greater than or equal to 75.0 nmol/L may       indicate an independent risk factor for CHD,       but must be evaluated with caution when applied       to non-Caucasian populations due to the       influence of genetic factors on Lp(a) across       ethnicities. Performed At: [facility] [address] [place], [place] [id number] Maria Shorter MD Ey:[id number]     Lab Results  Component Value Date   NA 136 06/17/2024   K 4.0 06/17/2024   CO2 24 06/17/2024   GLUCOSE 113 (H) 06/17/2024   BUN 20 06/17/2024   CREATININE 0.80 06/17/2024   CALCIUM  9.3 06/17/2024   GFRNONAA >60 06/17/2024      Latest Ref Rng & Units 06/17/2024    4:30 PM 06/17/2024    4:03 PM 06/13/2024   11:02 AM  BMP  Glucose 70 -  99 mg/dL 886  886  98   BUN 6 - 20 mg/dL 20  18  15    Creatinine 0.44 - 1.00 mg/dL 9.19  9.19  9.35   Sodium 135 - 145 mmol/L 136  136  135   Potassium 3.5 - 5.1 mmol/L 4.0  4.1  4.1   Chloride 98 - 111 mmol/L 102  102  100   CO2 22 - 32 mmol/L  24  21   Calcium  8.9 - 10.3 mg/dL  9.3  9.4       Latest Ref Rng & Units 06/17/2024    4:30 PM 06/17/2024    4:03 PM 06/07/2024    8:24 AM  CBC  WBC 4.0 - 10.5 K/uL  11.5  15.3   Hemoglobin 12.0 - 15.0 g/dL 84.9  85.4  85.7   Hematocrit 36.0 - 46.0 % 44.0  45.0  43.1   Platelets 150 - 400 K/uL  344  267    Lab Results  Component Value Date    HGBA1C 5.2 06/05/2024    Lab Results  Component Value Date   TSH 1.50 06/16/2016     ROS  Review of Systems  Cardiovascular:  Negative for chest pain, dyspnea on exertion and leg swelling.   Physical Exam:   VS:  BP 123/77 (BP Location: Left Arm, Patient Position: Sitting, Cuff Size: Normal)   Pulse 76   Resp 16   Ht 5' 4 (1.626 m)   Wt 150 lb (68 kg)   LMP 11/18/2015 (Exact Date)   SpO2 99%   BMI 25.75 kg/m    Wt Readings from Last 3 Encounters:  06/19/24 150 lb (68 kg)  06/17/24 149 lb 11.1 oz (67.9 kg)  06/13/24 149 lb 12.8 oz (67.9 kg)    Physical Exam Neck:     Vascular: No carotid bruit or JVD.  Cardiovascular:     Rate and Rhythm: Normal rate and regular rhythm.     Pulses: Intact distal pulses.     Heart sounds: Normal heart sounds. No murmur heard.    No gallop.  Pulmonary:     Effort: Pulmonary effort is normal.     Breath sounds: Normal breath sounds.  Abdominal:     General: Bowel sounds are normal.     Palpations: Abdomen is soft.  Musculoskeletal:     Right lower leg: No edema.     Left lower leg: No edema.    Studies Reviewed: .    Cardiac Catheterization 06/05/24:   Proximal LAD  3.5 x 16 mm Synergy XD DES. Large D1 90% sidebranch occlusion has been balloon angioplasty with 2.0 x 12 mm emerge at 3 atmospheric pressure.     ECHOCARDIOGRAM COMPLETE 06/06/2024  1. Left ventricular ejection fraction, by estimation, is 40 to 45%. The left ventricle has mildly decreased function. The left ventricle demonstrates regional wall motion abnormalities (see scoring diagram/findings for description). There is mild concentric left ventricular hypertrophy. Left ventricular diastolic parameters are indeterminate. 2. Right ventricular systolic function is normal. The right ventricular size is normal. Tricuspid regurgitation signal is inadequate for assessing PA pressure. 3. The mitral valve is normal in structure. Mild mitral valve regurgitation. No evidence of  mitral stenosis. 4. The aortic valve is normal in structure. Aortic valve regurgitation is not visualized. No aortic stenosis is present. 5. The inferior vena cava is normal in size with greater than 50% respiratory variability, suggesting right atrial pressure of 3 mmHg.  Echocardiogram personally reviewed, LVEF 30%.  WMA: The basal anterolateral segment, mid inferoseptal segment, apical septal segment, basal anterior segment, and apex are akinetic. The mid and distal anterior wall, mid and distal lateral wall, anterior septum, entire inferior wall, posterior wall, mid anterolateral segment, and basal inferoseptal segment are hypokinetic. EKG:       EKG 06/13/2024: Normal sinus rhythm with rate of 77 bpm, left intrafascicular block.  Anterolateral infarct old.  Comparison to 724 and 727 EKG not performed due to lead misplacement.  Medications ordered    Meds ordered this encounter  Medications   sacubitril -valsartan  (ENTRESTO ) 49-51 MG    Sig: Take 1 tablet by mouth 2 (two) times daily.    Dispense:  60 tablet    Refill:  2    Patient to stop Losartan      ASSESSMENT AND PLAN: .      ICD-10-CM   1. Coronary artery disease involving native coronary artery of native heart without angina pectoris  I25.10 Lipid Profile    2. Chronic systolic heart failure (HCC)  P49.77 sacubitril -valsartan  (ENTRESTO ) 49-51 MG    3. Ischemic cardiomyopathy  I25.5     4. Pure hypercholesterolemia  E78.00 Lipid Profile     Assessment & Plan Recent large anterolateral myocardial infarction with reduced ejection fraction Experienced a large myocardial infarction with reduced ejection fraction 30%.  Presently no clinical evidence of heart failure and tolerating losartan  50 mg daily and also spironolactone  25 mg daily. Also had non-sustained ventricular tachycardia during recovery and presently wearing LifeVest.  The heart is in a state of myocardial stunning, but ejection fraction is expected to  improve to 40-45% over the next year. Currently well-managed with no signs of heart failure or volume overload. Tolerating current medication regimen, including losartan , well. - Transition from losartan  to Entresto , starting at a moderate dose, 1 pill twice a day check BMP in 1 week to 10 days - Instruct to monitor for dizziness or lightheadedness and adjust Entresto  dose if necessary - Initiate cardiac rehab - Schedule follow-up in 3-4 months as she is also seeing heart failure clinic and further up-titration will be with HF clinic. Continue Metoprolol  Succinate 25 mg daily for now until seen by CHF clinic.  - Patient and her husband are aware of hyperkalemia as a risk factor and avoidance of excess fruit.  Hyperlipidemia LDL cholesterol previously elevated at 146 mg/dL. Family history of cardiovascular disease, including heart attacks in father and grandmother. Emphasized importance of lowering LDL to 55 mg/dL to reduce cardiovascular risk. Currently on cholesterol-lowering medication. - Continue current cholesterol-lowering medication - Aim to reduce LDL cholesterol to 55 mg/dL   Signed,  Gordy Bergamo, MD, [facility] 06/19/2024, 5:08 PM [facility] [address] [place], [place] [address] Phone: [phone]. Fax:  [phone]

## 2024-06-19 NOTE — Addendum Note (Signed)
 Addended by: Don Giarrusso L on: 06/19/2024 05:20 PM   Modules accepted: Orders

## 2024-06-19 NOTE — Patient Instructions (Addendum)
 Medication Instructions:  Your physician has recommended you make the following change in your medication:  Stop losartan   Start Entresto  49/51 by mouth twice daily   *If you need a refill on your cardiac medications before your next appointment, please call your pharmacy*  Lab Work: Have BMP and lipids done in 2 weeks.  (BMP has already been ordered by CHF clinic)--This will be fasting If you have labs (blood work) drawn today and your tests are completely normal, you will receive your results only by: MyChart Message (if you have MyChart) OR A paper copy in the mail If you have any lab test that is abnormal or we need to change your treatment, we will call you to review the results.  Testing/Procedures: none  Follow-Up: At [facility], you and your health needs are our priority.  As part of our continuing mission to provide you with exceptional heart care, our providers are all part of one team.  This team includes your primary Cardiologist (physician) and Advanced Practice Providers or APPs (Physician Assistants and Nurse Practitioners) who all work together to provide you with the care you need, when you need it.  Your next appointment:   11/24  Provider:   Gordy Bergamo, MD    We recommend signing up for the patient portal called MyChart.  Sign up information is provided on this After Visit Summary.  MyChart is used to connect with patients for Virtual Visits (Telemedicine).  Patients are able to view lab/test results, encounter notes, upcoming appointments, etc.  Non-urgent messages can be sent to your provider as well.   To learn more about what you can do with MyChart, go to [email or web address].   Other Instructions

## 2024-06-20 DIAGNOSIS — I214 Non-ST elevation (NSTEMI) myocardial infarction: Secondary | ICD-10-CM | POA: Diagnosis not present

## 2024-06-20 DIAGNOSIS — I5022 Chronic systolic (congestive) heart failure: Secondary | ICD-10-CM | POA: Diagnosis not present

## 2024-06-20 DIAGNOSIS — D72829 Elevated white blood cell count, unspecified: Secondary | ICD-10-CM | POA: Diagnosis not present

## 2024-06-21 ENCOUNTER — Encounter (HOSPITAL_COMMUNITY): Payer: Self-pay

## 2024-06-21 ENCOUNTER — Telehealth (HOSPITAL_COMMUNITY): Payer: Self-pay

## 2024-06-21 ENCOUNTER — Other Ambulatory Visit (HOSPITAL_COMMUNITY)

## 2024-06-21 NOTE — Telephone Encounter (Signed)
 Called patient to see if she was interested in participating in the Cardiac Rehab Program. Patient will come in for orientation on 8/19@1045  and will attend the 145 exercise class.   Srent package.

## 2024-07-02 ENCOUNTER — Other Ambulatory Visit (HOSPITAL_COMMUNITY): Payer: Self-pay

## 2024-07-03 DIAGNOSIS — I5022 Chronic systolic (congestive) heart failure: Secondary | ICD-10-CM | POA: Diagnosis not present

## 2024-07-03 DIAGNOSIS — I255 Ischemic cardiomyopathy: Secondary | ICD-10-CM | POA: Diagnosis not present

## 2024-07-03 DIAGNOSIS — E7800 Pure hypercholesterolemia, unspecified: Secondary | ICD-10-CM | POA: Diagnosis not present

## 2024-07-03 DIAGNOSIS — I2510 Atherosclerotic heart disease of native coronary artery without angina pectoris: Secondary | ICD-10-CM | POA: Diagnosis not present

## 2024-07-04 ENCOUNTER — Ambulatory Visit: Payer: Self-pay | Admitting: Cardiology

## 2024-07-04 DIAGNOSIS — I213 ST elevation (STEMI) myocardial infarction of unspecified site: Secondary | ICD-10-CM | POA: Diagnosis not present

## 2024-07-04 LAB — LIPID PANEL
Chol/HDL Ratio: 2.8 ratio (ref 0.0–4.4)
Cholesterol, Total: 121 mg/dL (ref 100–199)
HDL: 44 mg/dL (ref >39)
LDL Chol Calc (NIH): 50 mg/dL (ref 0–99)
Triglycerides: 159 mg/dL — ABNORMAL HIGH (ref 0–149)
VLDL Cholesterol Cal: 27 mg/dL (ref 5–40)

## 2024-07-04 LAB — BASIC METABOLIC PANEL WITH GFR
BUN/Creatinine Ratio: 25 (ref 12–28)
BUN: 20 mg/dL (ref 8–27)
CO2: 19 mmol/L — ABNORMAL LOW (ref 20–29)
Calcium: 9.9 mg/dL (ref 8.7–10.3)
Chloride: 100 mmol/L (ref 96–106)
Creatinine, Ser: 0.79 mg/dL (ref 0.57–1.00)
Glucose: 98 mg/dL (ref 70–99)
Potassium: 4.8 mmol/L (ref 3.5–5.2)
Sodium: 139 mmol/L (ref 134–144)
eGFR: 86 mL/min/1.73 (ref >59)

## 2024-07-04 NOTE — Progress Notes (Signed)
 Normal renal function. LDL is now at goal <55. Continue present medical management

## 2024-07-06 ENCOUNTER — Telehealth (HOSPITAL_COMMUNITY): Payer: Self-pay | Admitting: *Deleted

## 2024-07-06 ENCOUNTER — Ambulatory Visit (HOSPITAL_COMMUNITY): Payer: Self-pay | Admitting: Cardiology

## 2024-07-06 ENCOUNTER — Ambulatory Visit (HOSPITAL_COMMUNITY)
Admission: RE | Admit: 2024-07-06 | Discharge: 2024-07-06 | Disposition: A | Discharge status: Home / Self Care | Source: Ambulatory Visit | Attending: Cardiology | Admitting: Cardiology

## 2024-07-06 VITALS — BP 118/70 | HR 67 | Wt 147.6 lb

## 2024-07-06 DIAGNOSIS — I255 Ischemic cardiomyopathy: Secondary | ICD-10-CM

## 2024-07-06 DIAGNOSIS — Z955 Presence of coronary angioplasty implant and graft: Secondary | ICD-10-CM | POA: Insufficient documentation

## 2024-07-06 DIAGNOSIS — Z7982 Long term (current) use of aspirin: Secondary | ICD-10-CM | POA: Diagnosis not present

## 2024-07-06 DIAGNOSIS — I5022 Chronic systolic (congestive) heart failure: Secondary | ICD-10-CM

## 2024-07-06 DIAGNOSIS — I213 ST elevation (STEMI) myocardial infarction of unspecified site: Secondary | ICD-10-CM | POA: Insufficient documentation

## 2024-07-06 DIAGNOSIS — I2510 Atherosclerotic heart disease of native coronary artery without angina pectoris: Secondary | ICD-10-CM | POA: Insufficient documentation

## 2024-07-06 DIAGNOSIS — E7800 Pure hypercholesterolemia, unspecified: Secondary | ICD-10-CM | POA: Diagnosis not present

## 2024-07-06 DIAGNOSIS — Z79899 Other long term (current) drug therapy: Secondary | ICD-10-CM | POA: Diagnosis not present

## 2024-07-06 DIAGNOSIS — Z7984 Long term (current) use of oral hypoglycemic drugs: Secondary | ICD-10-CM | POA: Insufficient documentation

## 2024-07-06 DIAGNOSIS — I472 Ventricular tachycardia, unspecified: Secondary | ICD-10-CM

## 2024-07-06 DIAGNOSIS — Z7902 Long term (current) use of antithrombotics/antiplatelets: Secondary | ICD-10-CM | POA: Insufficient documentation

## 2024-07-06 LAB — BASIC METABOLIC PANEL WITH GFR
Anion gap: 9 (ref 5–15)
BUN: 17 mg/dL (ref 6–20)
CO2: 26 mmol/L (ref 22–32)
Calcium: 9.9 mg/dL (ref 8.9–10.3)
Chloride: 104 mmol/L (ref 98–111)
Creatinine, Ser: 0.79 mg/dL (ref 0.44–1.00)
GFR, Estimated: >60 mL/min (ref >60)
Glucose, Bld: 101 mg/dL — ABNORMAL HIGH (ref 70–99)
Potassium: 4.8 mmol/L (ref 3.5–5.1)
Sodium: 139 mmol/L (ref 135–145)

## 2024-07-06 LAB — BRAIN NATRIURETIC PEPTIDE: B Natriuretic Peptide: 602.9 pg/mL — ABNORMAL HIGH (ref 0.0–100.0)

## 2024-07-06 MED ORDER — SPIRONOLACTONE 25 MG PO TABS: 25.0000 mg | ORAL_TABLET | Freq: Every day | ORAL | 3 refills | Status: DC

## 2024-07-06 MED ORDER — METOPROLOL SUCCINATE ER 50 MG PO TB24: 50.0000 mg | ORAL_TABLET | Freq: Every day | ORAL | 3 refills | Status: DC

## 2024-07-06 NOTE — Telephone Encounter (Signed)
Left message to call cardiac rehab regarding cardiac rehab orientation.Thayer Headings RN BSN

## 2024-07-06 NOTE — Patient Instructions (Signed)
 Medication Changes:  CHANGE///INCREASE Metoprolol  XL to 50 mg Daily at bedtime  CHANGE///INCREASE Spironolactone  to 25 mg (1 tab) Daily at bedtime  Lab Work:  Labs done today, your results will be available in MyChart, we will contact you for abnormal readings.   Testing/Procedures:  Your physician has requested that you have an echocardiogram. Echocardiography is a painless test that uses sound waves to create images of your heart. It provides your doctor with information about the size and shape of your heart and how well your heart's chambers and valves are working. This procedure takes approximately one hour. There are no restrictions for this procedure. Please do NOT wear cologne, perfume, aftershave, or lotions (deodorant is allowed). Please arrive 15 minutes prior to your appointment time. IN 2 MONTHS  Please note: We ask at that you not bring children with you during ultrasound (echo/ vascular) testing. Due to room size and safety concerns, children are not allowed in the ultrasound rooms during exams. Our front office staff cannot provide observation of children in our lobby area while testing is being conducted. An adult accompanying a patient to their appointment will only be allowed in the ultrasound room at the discretion of the ultrasound technician under special circumstances. We apologize for any inconvenience.  Special Instructions // Education:  Do the following things EVERYDAY: Weigh yourself in the morning before breakfast. Write it down and keep it in a log. Take your medicines as prescribed Eat low salt foods--Limit salt (sodium) to 2000 mg per day.  Stay as active as you can everyday Limit all fluids for the day to less than 2 liters   Follow-Up in: 2 MONTHS WITH ECHOCARDIOGRAM   At the [facility], you and your health needs are our priority. We have a designated team specialized in the treatment of Heart Failure. This Care Team includes your  primary Heart Failure Specialized Cardiologist (physician), Advanced Practice Providers (APPs- Physician Assistants and Nurse Practitioners), and Pharmacist who all work together to provide you with the care you need, when you need it.   You may see any of the following providers on your designated Care Team at your next follow up:  Dr. Toribio Fuel Dr. Ezra Shuck Dr. Ria Commander Dr. Odis Brownie Greig Mosses, NP Caffie Shed, [place] [facility] [facility], [place] Beckey Coe, NP [place] Lee, NP Tinnie Redman, PharmD   Please be sure to bring in all your medications bottles to every appointment.   Need to Contact Us :  If you have any questions or concerns before your next appointment please send us  a message through [place] or call our office at [phone].    TO LEAVE A MESSAGE FOR THE NURSE SELECT OPTION 2, PLEASE LEAVE A MESSAGE INCLUDING: YOUR NAME DATE OF BIRTH CALL BACK NUMBER REASON FOR CALL**this is important as we prioritize the call backs  YOU WILL RECEIVE A CALL BACK THE SAME DAY AS LONG AS YOU CALL BEFORE 4:00 PM

## 2024-07-06 NOTE — Progress Notes (Signed)
 Travel reimbursement form completed during appointment, signed by Dr Rolan and returned to patient.  Powell Latino, RN, BSN, [facility] Specialty Coordinator [facility]

## 2024-07-07 NOTE — Progress Notes (Signed)
 PCP: Rolinda Millman, MD  HF Cardiology: Dr. Rolan  Chief Complaint: CHF  HPI: Referred to HF MD clinic from [facility] clinic for further management of CHF. .   Maria Zamora is a 64 y.o. female with history of no significant medical history other than hypercholesterolemia. Patient presented 06/05/24 with anterolateral STEMI. LHC with 70-80% stenosis in tiny OM1, 40-50% OM2, occluded proximal LAD successfully treated with DES. Echo in 7/25 showed EF around 30% (on my review). GDMT titrated but limited by soft BP. Had runs of NSVT post reperfusion and was fitted for LifeVest.   She returns for followup of CHF.  She has been wearing her Lifevest except when showering.  She will be starting cardiac rehab soon.  Weight down 2 lbs.  She gets lightheaded (mildly) after taking her morning medications (SBP runs in 90s). No chest pain.  No exertional dyspnea unless she is walking outside on a hot day.  No palpitations.  No orthopnea/PND.   ECG (personally reviewed): NSR, old anterior MI (with about 1 mm residual ST elevation).   Labs (8/25): LDL 50, K 4.8, creatinine 9.20  PMH: 1. Hyperlipidemia 2. CAD: Anterior STEMI in 7/25.  Cath with 70-80% stenosis in tiny OM1, 40-50% OM2, occluded proximal LAD successfully treated with DES. 3. Chronic systolic CHF: Ischemic cardiomyopathy.  Echo (7/25) with EF 30% by my read.   SH: She is a retired Manufacturing systems engineer. No ETOH, tobacco or drug use.  Married, 1 daughter .  FH: Has strong family history of CAD on father's side (father and aunts with MIs at young ages).   ROS: All systems reviewed and negative except as per HPI.    Current Outpatient Medications  Medication Sig Dispense Refill   aspirin  EC 81 MG tablet Take 1 tablet (81 mg total) by mouth daily. Swallow whole. 120 tablet 2   atorvastatin  (LIPITOR ) 80 MG tablet Take 1 tablet (80 mg total) by mouth daily. 30 tablet 11   cetirizine (ZYRTEC) 10 MG tablet Take 10 mg by mouth daily as  needed for allergies (seasonal).     dapagliflozin  propanediol (FARXIGA ) 10 MG TABS tablet Take 1 tablet (10 mg total) by mouth daily. 30 tablet 11   fexofenadine (ALLEGRA) 180 MG tablet Take 180 mg by mouth daily as needed for allergies or rhinitis (Seasonal).     Multiple Vitamin (MULTIVITAMIN WITH MINERALS) TABS tablet Take 1 tablet by mouth daily.     nitroGLYCERIN  (NITROSTAT ) 0.4 MG SL tablet Place 1 tablet (0.4 mg total) under the tongue every 5 (five) minutes as needed for chest pain. 25 tablet 3   sacubitril -valsartan  (ENTRESTO ) 49-51 MG Take 1 tablet by mouth 2 (two) times daily. 60 tablet 2   ticagrelor  (BRILINTA ) 90 MG TABS tablet Take 1 tablet (90 mg total) by mouth 2 (two) times daily. 60 tablet 11   metoprolol  succinate (TOPROL -XL) 50 MG 24 hr tablet Take 1 tablet (50 mg total) by mouth at bedtime. 30 tablet 3   spironolactone  (ALDACTONE ) 25 MG tablet Take 1 tablet (25 mg total) by mouth at bedtime. 30 tablet 3   No current facility-administered medications for this encounter.    No Known Allergies    Social History   Socioeconomic History   Marital status: Married    Spouse name: Marcey   Number of children: 1   Years of education: Not on file   Highest education level: Bachelor's degree (e.g., BA, AB, BS)  Occupational History  Occupation: Retired  Tobacco Use   Smoking status: Never   Smokeless tobacco: Never  Vaping Use   Vaping status: Never Used  Substance and Sexual Activity   Alcohol use: Yes    Alcohol/week: 5.0 - 7.0 standard drinks of alcohol    Types: 5 - 7 Standard drinks or equivalent per week    Comment: weekly   Drug use: No   Sexual activity: Yes    Partners: Male    Birth control/protection: Post-menopausal  Other Topics Concern   Not on file  Social History Narrative   Not on file   Social Drivers of Health   Financial Resource Strain: Low Risk  (06/07/2024)   Overall Financial Resource Strain (CARDIA)    Difficulty of Paying Living  Expenses: Not very hard  Food Insecurity: No Food Insecurity (06/06/2024)   Hunger Vital Sign    Worried About Running Out of Food in the Last Year: Never true    Ran Out of Food in the Last Year: Never true  Transportation Needs: No Transportation Needs (06/06/2024)   PRAPARE - Administrator, Civil Service (Medical): No    Lack of Transportation (Non-Medical): No  Physical Activity: Not on file  Stress: Not on file  Social Connections: Unknown (06/06/2024)   Social Connection and Isolation Panel    Frequency of Communication with Friends and Family: Not on file    Frequency of Social Gatherings with Friends and Family: Not on file    Attends Religious Services: Not on file    Active Member of Clubs or Organizations: Not on file    Attends Banker Meetings: Not on file    Marital Status: Married  Intimate Partner Violence: Not At Risk (06/06/2024)   Humiliation, Afraid, Rape, and Kick questionnaire    Fear of Current or Ex-Partner: No    Emotionally Abused: No    Physically Abused: No    Sexually Abused: No      Family History  Problem Relation Age of Onset   Heart attack Father    Hypertension Father    Hypertension Brother    Cancer Maternal Uncle        prostate   Cancer Maternal Grandfather        prostate   Heart attack Paternal Grandmother    Heart disease Paternal Grandmother    Heart attack Paternal Grandfather    Heart disease Paternal Grandfather     Vitals:   07/06/24 1012  BP: 118/70  Pulse: 67  SpO2: 99%  Weight: 67 kg (147 lb 9.6 oz)    PHYSICAL EXAM: General: NAD Neck: No JVD, no thyromegaly or thyroid nodule.  Lungs: Clear to auscultation bilaterally with normal respiratory effort. CV: Nondisplaced PMI.  Heart regular S1/S2, no S3/S4, no murmur.  No peripheral edema.  No carotid bruit.  Normal pedal pulses.  Abdomen: Soft, nontender, no hepatosplenomegaly, no distention.  Skin: Intact without lesions or rashes.   Neurologic: Alert and oriented x 3.  Psych: Normal affect. Extremities: No clubbing or cyanosis.  HEENT: Normal.   ASSESSMENT & PLAN: 1.  Chronic systolic CHF: Ischemic cardiomyopathy.  Echo in 7/25 with LVEF read as 40-45% (EF at most 30-35% on my review), WMA in LAD territory.  She is wearing a Lifevest.  NYHA class II, not volume overloaded on exam.  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  - Continue  Entresto  49/51 bid.  - With orthostatic symptoms after taking morning meds, move spironolactone  to 25 mg at bedtime.  - I will increase Toprol  XL to 50 mg daily but will have her take it at bedtime.  - Continue Farxiga  10 mg daily - BMET/BNP.  - Repeat echo in 10/25.  If EF remains low, she will need ICD.  Not CRT candidate with narrow QRS.  Continue Lifevest until decision made on ICD.  2. CAD: Anterior STEMI in 7/25. LHC with 70-80% tiny OM1, 40-50% OM2, occluded p LAD s/p PTCA/DES.  No chest pain.  - Continue ASA + ticagrelor  X 1 year, then would transition to single antiplatelet agent with Plavix.  - On high-intensity statin, good LDL in 8/25.  - Start cardiac rehab.  3. HLD: LDL < 55 in 8/25, continue atorvastatin  80 mg daily.  4. NSVT: Noted post reperfusion during 7/25 hospitalization.  - Now wearing LifeVest.  - Echo 3 months post-PCI to determine need for ICD.   Followup in 10/25 with echo.   I spent 56 minutes reviewing records, interviewing/examining patient, and managing orders.   Ezra Shuck 07/07/2024

## 2024-07-10 ENCOUNTER — Encounter (HOSPITAL_COMMUNITY): Payer: Self-pay

## 2024-07-10 ENCOUNTER — Encounter (HOSPITAL_COMMUNITY)
Admission: RE | Admit: 2024-07-10 | Discharge: 2024-07-10 | Disposition: A | Discharge status: Home / Self Care | Source: Ambulatory Visit | Attending: Cardiology | Admitting: Cardiology

## 2024-07-10 VITALS — BP 102/64 | HR 77 | Ht 65.0 in | Wt 146.4 lb

## 2024-07-10 DIAGNOSIS — I213 ST elevation (STEMI) myocardial infarction of unspecified site: Secondary | ICD-10-CM | POA: Diagnosis not present

## 2024-07-10 DIAGNOSIS — Z48812 Encounter for surgical aftercare following surgery on the circulatory system: Secondary | ICD-10-CM | POA: Diagnosis not present

## 2024-07-10 DIAGNOSIS — I252 Old myocardial infarction: Secondary | ICD-10-CM | POA: Diagnosis not present

## 2024-07-10 DIAGNOSIS — Z955 Presence of coronary angioplasty implant and graft: Secondary | ICD-10-CM | POA: Insufficient documentation

## 2024-07-10 NOTE — Progress Notes (Signed)
 Cardiac Rehab Medication Review by a Nurse  Does the patient  feel that his/her medications are working for him/her?  yes  Has the patient been experiencing any side effects to the medications prescribed?  no  Does the patient measure his/her own blood pressure or blood glucose at home?  yes   Does the patient have any problems obtaining medications due to transportation or finances?   no  Understanding of regimen: good Understanding of indications: good Potential of compliance: excellent    Nurse comments: Maria Zamora is taking her medications as prescribed and has a good understanding of what her medications are for. Maria Zamora checks her blood pressures every few days.    Maria Zamora [facility] RN 07/10/2024 10:47 AM

## 2024-07-10 NOTE — Progress Notes (Signed)
 Cardiac Individual Treatment Plan  Patient Details  Name: Maria Zamora MRN: [id number] Date of Birth: 20-Dec-1960 Referring Provider:   Flowsheet Row INTENSIVE CARDIAC REHAB ORIENT from 07/10/2024 in [facility]  Referring Provider Ladona Heinz, MD    Initial Encounter Date:  Flowsheet Row INTENSIVE CARDIAC REHAB ORIENT from 07/10/2024 in [facility]  Date 07/10/24    Visit Diagnosis: 06/05/24 STEMI  06/05/24 DES LAD, D1, PTA  Patient's Home Medications on Admission:  Current Outpatient Medications:    aspirin  EC 81 MG tablet, Take 1 tablet (81 mg total) by mouth daily. Swallow whole., Disp: 120 tablet, Rfl: 2   atorvastatin  (LIPITOR ) 80 MG tablet, Take 1 tablet (80 mg total) by mouth daily., Disp: 30 tablet, Rfl: 11   cetirizine (ZYRTEC) 10 MG tablet, Take 10 mg by mouth daily as needed for allergies (seasonal)., Disp: , Rfl:    dapagliflozin  propanediol (FARXIGA ) 10 MG TABS tablet, Take 1 tablet (10 mg total) by mouth daily., Disp: 30 tablet, Rfl: 11   fexofenadine (ALLEGRA) 180 MG tablet, Take 180 mg by mouth daily as needed for allergies or rhinitis (Seasonal)., Disp: , Rfl:    metoprolol  succinate (TOPROL -XL) 50 MG 24 hr tablet, Take 1 tablet (50 mg total) by mouth at bedtime., Disp: 30 tablet, Rfl: 3   Multiple Vitamin (MULTIVITAMIN WITH MINERALS) TABS tablet, Take 1 tablet by mouth daily., Disp: , Rfl:    nitroGLYCERIN  (NITROSTAT ) 0.4 MG SL tablet, Place 1 tablet (0.4 mg total) under the tongue every 5 (five) minutes as needed for chest pain., Disp: 25 tablet, Rfl: 3   sacubitril -valsartan  (ENTRESTO ) 49-51 MG, Take 1 tablet by mouth 2 (two) times daily., Disp: 60 tablet, Rfl: 2   spironolactone  (ALDACTONE ) 25 MG tablet, Take 1 tablet (25 mg total) by mouth at bedtime., Disp: 30 tablet, Rfl: 3   ticagrelor  (BRILINTA ) 90 MG TABS tablet, Take 1 tablet (90 mg total)  by mouth 2 (two) times daily., Disp: 60 tablet, Rfl: 11  Past Medical History: Past Medical History:  Diagnosis Date   Coronary artery disease    Hyperlipidemia    MI (myocardial infarction) (HCC)     Tobacco Use: Social History   Tobacco Use  Smoking Status Never  Smokeless Tobacco Never    Labs: Review Flowsheet  More data exists      Latest Ref Rng & Units 06/13/2015 06/16/2016 06/05/2024 06/17/2024 07/03/2024  Labs for ITP Cardiac and Pulmonary Rehab  Cholestrol 100 - 199 mg/dL 794  789  774  - 878   LDL (calc) 0 - 99 mg/dL 71  889  866  - 50   HDL-C >39 mg/dL 61  73  47  - 44   Trlycerides 0 - 149 mg/dL 635  866  774  - 840   Hemoglobin A1c 4.8 - 5.6 % - - 5.2  - -  TCO2 22 - 32 mmol/L - - - 26  -    Capillary Blood Glucose: No results found for: GLUCAP   Exercise Target Goals: Exercise Program Goal: Individual exercise prescription set using results from initial 6 min walk test and THRR while considering  patient's activity barriers and safety.   Exercise Prescription Goal: Initial exercise prescription builds to 30-45 minutes a day of aerobic activity, 2-3 days per week.  Home exercise guidelines will be given to patient during program as part of exercise prescription that  the participant will acknowledge.  Activity Barriers & Risk Stratification:  Activity Barriers & Cardiac Risk Stratification - 07/10/24 1144       Activity Barriers & Cardiac Risk Stratification   Activity Barriers Other (comment)    Comments Low ejection fraction    Cardiac Risk Stratification High          6 Minute Walk:  6 Minute Walk     Row Name 07/10/24 1154         6 Minute Walk   Phase Initial     Distance 1566 feet     Walk Time 6 minutes     # of Rest Breaks 0     MPH 2.96     METS 3.82     RPE 9     Perceived Dyspnea  0     VO2 Peak 13.37     Symptoms No     Resting HR 77 bpm     Resting BP 102/64     Resting Oxygen Saturation  98 %     Exercise Oxygen  Saturation  during 6 min walk 98 %     Max Ex. HR 101 bpm     Max Ex. BP 100/60     2 Minute Post BP 102/60        Oxygen Initial Assessment:   Oxygen Re-Evaluation:   Oxygen Discharge (Final Oxygen Re-Evaluation):   Initial Exercise Prescription:  Initial Exercise Prescription - 07/10/24 1200       Date of Initial Exercise RX and Referring Provider   Date 07/10/24    Referring Provider Ladona Heinz, MD    Expected Discharge Date 10/03/24      Bike   Level 1    Minutes 15    METs 2.5      NuStep   Level 1    SPM 85    Minutes 15    METs 2.5      Prescription Details   Frequency (times per week) 3    Duration Progress to 30 minutes of continuous aerobic without signs/symptoms of physical distress      Intensity   THRR 40-80% of Max Heartrate 64-128    Ratings of Perceived Exertion 11-13    Perceived Dyspnea 0-4      Progression   Progression Continue to progress workloads to maintain intensity without signs/symptoms of physical distress.      Resistance Training   Training Prescription Yes    Weight 2 lbs    Reps 10-15          Perform Capillary Blood Glucose checks as needed.  Exercise Prescription Changes:   Exercise Comments:   Exercise Goals and Review:   Exercise Goals     Row Name 07/10/24 1144             Exercise Goals   Increase Physical Activity Yes       Intervention Provide advice, education, support and counseling about physical activity/exercise needs.;Develop an individualized exercise prescription for aerobic and resistive training based on initial evaluation findings, risk stratification, comorbidities and participant's personal goals.       Expected Outcomes Short Term: Attend rehab on a regular basis to increase amount of physical activity.;Long Term: Exercising regularly at least 3-5 days a week.;Long Term: Add in home exercise to make exercise part of routine and to increase amount of physical activity.       Increase  Strength and Stamina Yes  Intervention Provide advice, education, support and counseling about physical activity/exercise needs.;Develop an individualized exercise prescription for aerobic and resistive training based on initial evaluation findings, risk stratification, comorbidities and participant's personal goals.       Expected Outcomes Short Term: Increase workloads from initial exercise prescription for resistance, speed, and METs.;Short Term: Perform resistance training exercises routinely during rehab and add in resistance training at home;Long Term: Improve cardiorespiratory fitness, muscular endurance and strength as measured by increased METs and functional capacity ( )       Able to understand and use rate of perceived exertion (RPE) scale Yes       Intervention Provide education and explanation on how to use RPE scale       Expected Outcomes Short Term: Able to use RPE daily in rehab to express subjective intensity level;Long Term:  Able to use RPE to guide intensity level when exercising independently       Knowledge and understanding of Target Heart Rate Range (THRR) Yes       Intervention Provide education and explanation of THRR including how the numbers were predicted and where they are located for reference       Expected Outcomes Short Term: Able to state/look up THRR;Long Term: Able to use THRR to govern intensity when exercising independently;Short Term: Able to use daily as guideline for intensity in rehab       Able to check pulse independently Yes       Intervention Provide education and demonstration on how to check pulse in carotid and radial arteries.;Review the importance of being able to check your own pulse for safety during independent exercise       Expected Outcomes Short Term: Able to explain why pulse checking is important during independent exercise;Long Term: Able to check pulse independently and accurately       Understanding of Exercise Prescription Yes        Intervention Provide education, explanation, and written materials on patient's individual exercise prescription       Expected Outcomes Short Term: Able to explain program exercise prescription;Long Term: Able to explain home exercise prescription to exercise independently          Exercise Goals Re-Evaluation :   Discharge Exercise Prescription (Final Exercise Prescription Changes):   Nutrition:  Target Goals: Understanding of nutrition guidelines, daily intake of sodium 1500mg , cholesterol 200mg , calories 30% from fat and 7% or less from saturated fats, daily to have 5 or more servings of fruits and vegetables.  Biometrics:  Pre Biometrics - 07/10/24 1030       Pre Biometrics   Waist Circumference 32 inches    Hip Circumference 41.75 inches    Waist to Hip Ratio 0.77 %    Triceps Skinfold 31 mm    % Body Fat 36 %    Grip Strength 16 kg    Flexibility 14.88 in    Single Leg Stand 28.18 seconds           Nutrition Therapy Plan and Nutrition Goals:   Nutrition Assessments:  MEDIFICTS Score Key: >=70 Need to make dietary changes  40-70 Heart Healthy Diet <= 40 Therapeutic Level Cholesterol Diet    Picture Your Plate Scores: <59 Unhealthy dietary pattern with much room for improvement. 41-50 Dietary pattern unlikely to meet recommendations for good health and room for improvement. 51-60 More healthful dietary pattern, with some room for improvement.  >60 Healthy dietary pattern, although there may be some specific behaviors that could be improved.  Nutrition Goals Re-Evaluation:   Nutrition Goals Re-Evaluation:   Nutrition Goals Discharge (Final Nutrition Goals Re-Evaluation):   Psychosocial: Target Goals: Acknowledge presence or absence of significant depression and/or stress, maximize coping skills, provide positive support system. Participant is able to verbalize types and ability to use techniques and skills needed for reducing stress and  depression.  Initial Review & Psychosocial Screening:  Initial Psych Review & Screening - 07/10/24 1235       Initial Review   Current issues with Current Stress Concerns    Source of Stress Concerns Chronic Illness;Unable to perform yard/household activities    Comments Avelynn repots that she has frequent urination now that she is taking a diuretic. Discussed sleep hygiene with the patient and her husband      Family Dynamics   Good Support System? Yes   Cadence has her husband, daughter, mother and firends for support. Adhira has a good support network     Barriers   Psychosocial barriers to participate in program The patient should benefit from training in stress management and relaxation.      Screening Interventions   Interventions Encouraged to exercise;To provide support and resources with identified psychosocial needs;Provide feedback about the scores to participant    Expected Outcomes Long Term Goal: Stressors or current issues are controlled or eliminated.;Short Term goal: Identification and review with participant of any Quality of Life or Depression concerns found by scoring the questionnaire.;Long Term goal: The participant improves quality of Life and PHQ9 Scores as seen by post scores and/or verbalization of changes          Quality of Life Scores:  Quality of Life - 07/10/24 1234       Quality of Life   Select Quality of Life      Quality of Life Scores   Health/Function Pre 25.2 %    Socioeconomic Pre 28.21 %    Psych/Spiritual Pre 26.57 %    Family Pre 30 %    GLOBAL Pre 26.81 %         Scores of 19 and below usually indicate a poorer quality of life in these areas.  A difference of  2-3 points is a clinically meaningful difference.  A difference of 2-3 points in the total score of the Quality of Life Index has been associated with significant improvement in overall quality of life, self-image, physical symptoms, and general health in studies assessing change  in quality of life.  PHQ-9: Review Flowsheet       07/10/2024 03/15/2024  Depression screen PHQ 2/9  Decreased Interest 0 0  Down, Depressed, Hopeless 0 0  PHQ - 2 Score 0 0  Altered sleeping 1 -  Tired, decreased energy 0 -  Change in appetite 0 -  Feeling bad or failure about yourself  0 -  Trouble concentrating 1 -  Moving slowly or fidgety/restless 0 -  Suicidal thoughts 0 -  PHQ-9 Score 2 -  Difficult doing work/chores Not difficult at all -   Interpretation of Total Score  Total Score Depression Severity:  1-4 = Minimal depression, 5-9 = Mild depression, 10-14 = Moderate depression, 15-19 = Moderately severe depression, 20-27 = Severe depression   Psychosocial Evaluation and Intervention:   Psychosocial Re-Evaluation:   Psychosocial Discharge (Final Psychosocial Re-Evaluation):   Vocational Rehabilitation: Provide vocational rehab assistance to qualifying candidates.   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 Participant will state understanding/return demonstration of topics presented.     Core Videos: Exercise    Move It!  Clinical staff conducted group or individual video education with verbal and written material and guidebook.  Patient learns the recommended Pritikin exercise program. Exercise with the goal of living a long, healthy life. Some of the health benefits of exercise include controlled diabetes, healthier blood pressure levels, improved cholesterol levels, improved heart and lung capacity, improved sleep, and better body composition. Everyone should speak with their doctor before starting or  changing an exercise routine.  Biomechanical Limitations Clinical staff conducted group or individual video education with verbal and written material and guidebook.  Patient learns how biomechanical limitations can impact exercise and how we can mitigate and possibly overcome limitations to have an impactful and balanced exercise routine.  Body Composition Clinical staff conducted group or individual video education with verbal and written material and guidebook.  Patient learns that body composition (ratio of muscle mass to fat mass) is a key component to assessing overall fitness, rather than body weight alone. Increased fat mass, especially visceral belly fat, can put us  at increased risk for metabolic syndrome, type 2 diabetes, heart disease, and even death. It is recommended to combine diet and exercise (cardiovascular and resistance training) to improve your body composition. Seek guidance from your physician and exercise physiologist before implementing an exercise routine.  Exercise Action Plan Clinical staff conducted group or individual video education with verbal and written material and guidebook.  Patient learns the recommended strategies to achieve and enjoy long-term exercise adherence, including variety, self-motivation, self-efficacy, and positive decision making. Benefits of exercise include fitness, good health, weight management, more energy, better sleep, less stress, and overall well-being.  Medical   Heart Disease Risk Reduction Clinical staff conducted group or individual video education with verbal and written material and guidebook.  Patient learns our heart is our most vital organ as it circulates oxygen, nutrients, white blood cells, and hormones throughout the entire body, and carries waste away. Data supports a plant-based eating plan like the Pritikin Program for its effectiveness in slowing progression of and reversing heart disease. The video provides a number of  recommendations to address heart disease.   Metabolic Syndrome and Belly Fat  Clinical staff conducted group or individual video education with verbal and written material and guidebook.  Patient learns what metabolic syndrome is, how it leads to heart disease, and how one can reverse it and keep it from coming back. You have metabolic syndrome if you have 3 of the following 5 criteria: abdominal obesity, high blood pressure, high triglycerides, low HDL cholesterol, and high blood sugar.  Hypertension and Heart Disease Clinical staff conducted group or individual video education with verbal and written material and guidebook.  Patient learns that high blood pressure, or hypertension, is very common in the [place] . Hypertension is largely due to excessive salt intake, but other important risk factors include being overweight, physical inactivity, drinking too much alcohol, smoking, and not eating enough potassium from fruits and vegetables. High blood pressure is a leading risk factor for heart attack, stroke, congestive heart failure, dementia, kidney failure, and premature death. Long-term effects of excessive salt intake  include stiffening of the arteries and thickening of heart muscle and organ damage. Recommendations include ways to reduce hypertension and the risk of heart disease.  Diseases of Our Time - Focusing on Diabetes Clinical staff conducted group or individual video education with verbal and written material and guidebook.  Patient learns why the best way to stop diseases of our time is prevention, through food and other lifestyle changes. Medicine (such as prescription pills and surgeries) is often only a Band-Aid on the problem, not a long-term solution. Most common diseases of our time include obesity, type 2 diabetes, hypertension, heart disease, and cancer. The Pritikin Program is recommended and has been proven to help reduce, reverse, and/or prevent the damaging effects of  metabolic syndrome.  Nutrition   Overview of the Pritikin Eating Plan  Clinical staff conducted group or individual video education with verbal and written material and guidebook.  Patient learns about the Pritikin Eating Plan for disease risk reduction. The Pritikin Eating Plan emphasizes a wide variety of unrefined, minimally-processed carbohydrates, like fruits, vegetables, whole grains, and legumes. Go, Caution, and Stop food choices are explained. Plant-based and lean animal proteins are emphasized. Rationale provided for low sodium intake for blood pressure control, low added sugars for blood sugar stabilization, and low added fats and oils for coronary artery disease risk reduction and weight management.  Calorie Density  Clinical staff conducted group or individual video education with verbal and written material and guidebook.  Patient learns about calorie density and how it impacts the Pritikin Eating Plan. Knowing the characteristics of the food you choose will help you decide whether those foods will lead to weight gain or weight loss, and whether you want to consume more or less of them. Weight loss is usually a side effect of the Pritikin Eating Plan because of its focus on low calorie-dense foods.  Label Reading  Clinical staff conducted group or individual video education with verbal and written material and guidebook.  Patient learns about the Pritikin recommended label reading guidelines and corresponding recommendations regarding calorie density, added sugars, sodium content, and whole grains.  Dining Out - Part 1  Clinical staff conducted group or individual video education with verbal and written material and guidebook.  Patient learns that restaurant meals can be sabotaging because they can be so high in calories, fat, sodium, and/or sugar. Patient learns recommended strategies on how to positively address this and avoid unhealthy pitfalls.  Facts on Fats  Clinical staff  conducted group or individual video education with verbal and written material and guidebook.  Patient learns that lifestyle modifications can be just as effective, if not more so, as many medications for lowering your risk of heart disease. A Pritikin lifestyle can help to reduce your risk of inflammation and atherosclerosis (cholesterol build-up, or plaque, in the artery walls). Lifestyle interventions such as dietary choices and physical activity address the cause of atherosclerosis. A review of the types of fats and their impact on blood cholesterol levels, along with dietary recommendations to reduce fat intake is also included.  Nutrition Action Plan  Clinical staff conducted group or individual video education with verbal and written material and guidebook.  Patient learns how to incorporate Pritikin recommendations into their lifestyle. Recommendations include planning and keeping personal health goals in mind as an important part of their success.  Healthy Mind-Set    Healthy Minds, Bodies, Hearts  Clinical staff conducted group or individual video education with verbal and written material and guidebook.  Patient learns  how to identify when they are stressed. Video will discuss the impact of that stress, as well as the many benefits of stress management. Patient will also be introduced to stress management techniques. The way we think, act, and feel has an impact on our hearts.  How Our Thoughts Can Heal Our Hearts  Clinical staff conducted group or individual video education with verbal and written material and guidebook.  Patient learns that negative thoughts can cause depression and anxiety. This can result in negative lifestyle behavior and serious health problems. Cognitive behavioral therapy is an effective method to help control our thoughts in order to change and improve our emotional outlook.  Additional Videos:  Exercise    Improving Performance  Clinical staff conducted group  or individual video education with verbal and written material and guidebook.  Patient learns to use a non-linear approach by alternating intensity levels and lengths of time spent exercising to help burn more calories and lose more body fat. Cardiovascular exercise helps improve heart health, metabolism, hormonal balance, blood sugar control, and recovery from fatigue. Resistance training improves strength, endurance, balance, coordination, reaction time, metabolism, and muscle mass. Flexibility exercise improves circulation, posture, and balance. Seek guidance from your physician and exercise physiologist before implementing an exercise routine and learn your capabilities and proper form for all exercise.  Introduction to Yoga  Clinical staff conducted group or individual video education with verbal and written material and guidebook.  Patient learns about yoga, a discipline of the coming together of mind, breath, and body. The benefits of yoga include improved flexibility, improved range of motion, better posture and core strength, increased lung function, weight loss, and positive self-image. Yoga's heart health benefits include lowered blood pressure, healthier heart rate, decreased cholesterol and triglyceride levels, improved immune function, and reduced stress. Seek guidance from your physician and exercise physiologist before implementing an exercise routine and learn your capabilities and proper form for all exercise.  Medical   Aging: Enhancing Your Quality of Life  Clinical staff conducted group or individual video education with verbal and written material and guidebook.  Patient learns key strategies and recommendations to stay in good physical health and enhance quality of life, such as prevention strategies, having an advocate, securing a Health Care Proxy and Power of Attorney, and keeping a list of medications and system for tracking them. It also discusses how to avoid risk for bone  loss.  Biology of Weight Control  Clinical staff conducted group or individual video education with verbal and written material and guidebook.  Patient learns that weight gain occurs because we consume more calories than we burn (eating more, moving less). Even if your body weight is normal, you may have higher ratios of fat compared to muscle mass. Too much body fat puts you at increased risk for cardiovascular disease, heart attack, stroke, type 2 diabetes, and obesity-related cancers. In addition to exercise, following the Pritikin Eating Plan can help reduce your risk.  Decoding Lab Results  Clinical staff conducted group or individual video education with verbal and written material and guidebook.  Patient learns that lab test reflects one measurement whose values change over time and are influenced by many factors, including medication, stress, sleep, exercise, food, hydration, pre-existing medical conditions, and more. It is recommended to use the knowledge from this video to become more involved with your lab results and evaluate your numbers to speak with your doctor.   Diseases of Our Time - Overview  Clinical staff conducted group or  individual video education with verbal and written material and guidebook.  Patient learns that according to the CDC, 50% to 70% of chronic diseases (such as obesity, type 2 diabetes, elevated lipids, hypertension, and heart disease) are avoidable through lifestyle improvements including healthier food choices, listening to satiety cues, and increased physical activity.  Sleep Disorders Clinical staff conducted group or individual video education with verbal and written material and guidebook.  Patient learns how good quality and duration of sleep are important to overall health and well-being. Patient also learns about sleep disorders and how they impact health along with recommendations to address them, including discussing with a physician.  Nutrition   Dining Out - Part 2 Clinical staff conducted group or individual video education with verbal and written material and guidebook.  Patient learns how to plan ahead and communicate in order to maximize their dining experience in a healthy and nutritious manner. Included are recommended food choices based on the type of restaurant the patient is visiting.   Fueling a Banker conducted group or individual video education with verbal and written material and guidebook.  There is a strong connection between our food choices and our health. Diseases like obesity and type 2 diabetes are very prevalent and are in large-part due to lifestyle choices. The Pritikin Eating Plan provides plenty of food and hunger-curbing satisfaction. It is easy to follow, affordable, and helps reduce health risks.  Menu Workshop  Clinical staff conducted group or individual video education with verbal and written material and guidebook.  Patient learns that restaurant meals can sabotage health goals because they are often packed with calories, fat, sodium, and sugar. Recommendations include strategies to plan ahead and to communicate with the manager, chef, or server to help order a healthier meal.  Planning Your Eating Strategy  Clinical staff conducted group or individual video education with verbal and written material and guidebook.  Patient learns about the Pritikin Eating Plan and its benefit of reducing the risk of disease. The Pritikin Eating Plan does not focus on calories. Instead, it emphasizes high-quality, nutrient-rich foods. By knowing the characteristics of the foods, we choose, we can determine their calorie density and make informed decisions.  Targeting Your Nutrition Priorities  Clinical staff conducted group or individual video education with verbal and written material and guidebook.  Patient learns that lifestyle habits have a tremendous impact on disease risk and progression. This  video provides eating and physical activity recommendations based on your personal health goals, such as reducing LDL cholesterol, losing weight, preventing or controlling type 2 diabetes, and reducing high blood pressure.  Vitamins and Minerals  Clinical staff conducted group or individual video education with verbal and written material and guidebook.  Patient learns different ways to obtain key vitamins and minerals, including through a recommended healthy diet. It is important to discuss all supplements you take with your doctor.   Healthy Mind-Set    Smoking Cessation  Clinical staff conducted group or individual video education with verbal and written material and guidebook.  Patient learns that cigarette smoking and tobacco addiction pose a serious health risk which affects millions of people. Stopping smoking will significantly reduce the risk of heart disease, lung disease, and many forms of cancer. Recommended strategies for quitting are covered, including working with your doctor to develop a successful plan.  Culinary   Becoming a Set designer conducted group or individual video education with verbal and written material and guidebook.  Patient  learns that cooking at home can be healthy, cost-effective, quick, and puts them in control. Keys to cooking healthy recipes will include looking at your recipe, assessing your equipment needs, planning ahead, making it simple, choosing cost-effective seasonal ingredients, and limiting the use of added fats, salts, and sugars.  Cooking - Breakfast and Snacks  Clinical staff conducted group or individual video education with verbal and written material and guidebook.  Patient learns how important breakfast is to satiety and nutrition through the entire day. Recommendations include key foods to eat during breakfast to help stabilize blood sugar levels and to prevent overeating at meals later in the day. Planning ahead is also a  key component.  Cooking - Educational psychologist conducted group or individual video education with verbal and written material and guidebook.  Patient learns eating strategies to improve overall health, including an approach to cook more at home. Recommendations include thinking of animal protein as a side on your plate rather than center stage and focusing instead on lower calorie dense options like vegetables, fruits, whole grains, and plant-based proteins, such as beans. Making sauces in large quantities to freeze for later and leaving the skin on your vegetables are also recommended to maximize your experience.  Cooking - Healthy Salads and Dressing Clinical staff conducted group or individual video education with verbal and written material and guidebook.  Patient learns that vegetables, fruits, whole grains, and legumes are the foundations of the Pritikin Eating Plan. Recommendations include how to incorporate each of these in flavorful and healthy salads, and how to create homemade salad dressings. Proper handling of ingredients is also covered. Cooking - Soups and [employer] - Soups and Desserts Clinical staff conducted group or individual video education with verbal and written material and guidebook.  Patient learns that Pritikin soups and desserts make for easy, nutritious, and delicious snacks and meal components that are low in sodium, fat, sugar, and calorie density, while high in vitamins, minerals, and filling fiber. Recommendations include simple and healthy ideas for soups and desserts.   Overview     The Pritikin Solution Program Overview Clinical staff conducted group or individual video education with verbal and written material and guidebook.  Patient learns that the results of the Pritikin Program have been documented in more than 100 articles published in peer-reviewed journals, and the benefits include reducing risk factors for (and, in some cases, even  reversing) high cholesterol, high blood pressure, type 2 diabetes, obesity, and more! An overview of the three key pillars of the Pritikin Program will be covered: eating well, doing regular exercise, and having a healthy mind-set.  WORKSHOPS  Exercise: Exercise Basics: Building Your Action Plan Clinical staff led group instruction and group discussion with PowerPoint presentation and patient guidebook. To enhance the learning environment the use of posters, models and videos may be added. At the conclusion of this workshop, patients will comprehend the difference between physical activity and exercise, as well as the benefits of incorporating both, into their routine. Patients will understand the FITT (Frequency, Intensity, Time, and Type) principle and how to use it to build an exercise action plan. In addition, safety concerns and other considerations for exercise and cardiac rehab will be addressed by the presenter. The purpose of this lesson is to promote a comprehensive and effective weekly exercise routine in order to improve patients' overall level of fitness.   Managing Heart Disease: Your Path to a Healthier Heart Clinical staff led group instruction and  group discussion with PowerPoint presentation and patient guidebook. To enhance the learning environment the use of posters, models and videos may be added.At the conclusion of this workshop, patients will understand the anatomy and physiology of the heart. Additionally, they will understand how Pritikin's three pillars impact the risk factors, the progression, and the management of heart disease.  The purpose of this lesson is to provide a high-level overview of the heart, heart disease, and how the Pritikin lifestyle positively impacts risk factors.  Exercise Biomechanics Clinical staff led group instruction and group discussion with PowerPoint presentation and patient guidebook. To enhance the learning environment the use of  posters, models and videos may be added. Patients will learn how the structural parts of their bodies function and how these functions impact their daily activities, movement, and exercise. Patients will learn how to promote a neutral spine, learn how to manage pain, and identify ways to improve their physical movement in order to promote healthy living. The purpose of this lesson is to expose patients to common physical limitations that impact physical activity. Participants will learn practical ways to adapt and manage aches and pains, and to minimize their effect on regular exercise. Patients will learn how to maintain good posture while sitting, walking, and lifting.  Balance Training and Fall Prevention  Clinical staff led group instruction and group discussion with PowerPoint presentation and patient guidebook. To enhance the learning environment the use of posters, models and videos may be added. At the conclusion of this workshop, patients will understand the importance of their sensorimotor skills (vision, proprioception, and the vestibular system) in maintaining their ability to balance as they age. Patients will apply a variety of balancing exercises that are appropriate for their current level of function. Patients will understand the common causes for poor balance, possible solutions to these problems, and ways to modify their physical environment in order to minimize their fall risk. The purpose of this lesson is to teach patients about the importance of maintaining balance as they age and ways to minimize their risk of falling.  WORKSHOPS   Nutrition:  Fueling a Ship broker led group instruction and group discussion with PowerPoint presentation and patient guidebook. To enhance the learning environment the use of posters, models and videos may be added. Patients will review the foundational principles of the Pritikin Eating Plan and understand what constitutes a  serving size in each of the food groups. Patients will also learn Pritikin-friendly foods that are better choices when away from home and review make-ahead meal and snack options. Calorie density will be reviewed and applied to three nutrition priorities: weight maintenance, weight loss, and weight gain. The purpose of this lesson is to reinforce (in a group setting) the key concepts around what patients are recommended to eat and how to apply these guidelines when away from home by planning and selecting Pritikin-friendly options. Patients will understand how calorie density may be adjusted for different weight management goals.  Mindful Eating  Clinical staff led group instruction and group discussion with PowerPoint presentation and patient guidebook. To enhance the learning environment the use of posters, models and videos may be added. Patients will briefly review the concepts of the Pritikin Eating Plan and the importance of low-calorie dense foods. The concept of mindful eating will be introduced as well as the importance of paying attention to internal hunger signals. Triggers for non-hunger eating and techniques for dealing with triggers will be explored. The purpose of this lesson is to  provide patients with the opportunity to review the basic principles of the Pritikin Eating Plan, discuss the value of eating mindfully and how to measure internal cues of hunger and fullness using the Hunger Scale. Patients will also discuss reasons for non-hunger eating and learn strategies to use for controlling emotional eating.  Targeting Your Nutrition Priorities Clinical staff led group instruction and group discussion with PowerPoint presentation and patient guidebook. To enhance the learning environment the use of posters, models and videos may be added. Patients will learn how to determine their genetic susceptibility to disease by reviewing their family history. Patients will gain insight into the  importance of diet as part of an overall healthy lifestyle in mitigating the impact of genetics and other environmental insults. The purpose of this lesson is to provide patients with the opportunity to assess their personal nutrition priorities by looking at their family history, their own health history and current risk factors. Patients will also be able to discuss ways of prioritizing and modifying the Pritikin Eating Plan for their highest risk areas  Menu  Clinical staff led group instruction and group discussion with PowerPoint presentation and patient guidebook. To enhance the learning environment the use of posters, models and videos may be added. Using menus brought in from [employer], or printed from [employer], patients will apply the Pritikin dining out guidelines that were presented in the [employer] video. Patients will also be able to practice these guidelines in a variety of provided scenarios. The purpose of this lesson is to provide patients with the opportunity to practice hands-on learning of the Pritikin Dining Out guidelines with actual menus and practice scenarios.  Label Reading Clinical staff led group instruction and group discussion with PowerPoint presentation and patient guidebook. To enhance the learning environment the use of posters, models and videos may be added. Patients will review and discuss the Pritikin label reading guidelines presented in Pritikin's Label Reading Educational series video. Using fool labels brought in from local grocery stores and markets, patients will apply the label reading guidelines and determine if the packaged food meet the Pritikin guidelines. The purpose of this lesson is to provide patients with the opportunity to review, discuss, and practice hands-on learning of the Pritikin Label Reading guidelines with actual packaged food labels. Cooking School  Pritikin's [employer] are designed to teach  patients ways to prepare quick, simple, and affordable recipes at home. The importance of nutrition's role in chronic disease risk reduction is reflected in its emphasis in the overall Pritikin program. By learning how to prepare essential core Pritikin Eating Plan recipes, patients will increase control over what they eat; be able to customize the flavor of foods without the use of added salt, sugar, or fat; and improve the quality of the food they consume. By learning a set of core recipes which are easily assembled, quickly prepared, and affordable, patients are more likely to prepare more healthy foods at home. These workshops focus on convenient breakfasts, simple entres, side dishes, and desserts which can be prepared with minimal effort and are consistent with nutrition recommendations for cardiovascular risk reduction. Cooking [employer] are taught by a Armed forces logistics/support/administrative officer (RD) who has been trained by the [employer]. The chef or RD has a clear understanding of the importance of minimizing - if not completely eliminating - added fat, sugar, and sodium in recipes. Throughout the series of Cooking School Workshop sessions, patients will learn about healthy ingredients  and efficient methods of cooking to build confidence in their capability to prepare    Cooking School weekly topics:  Adding Flavor- Sodium-Free  Fast and Healthy Breakfasts  Powerhouse Plant-Based Proteins  Satisfying Salads and Dressings  Simple Sides and Sauces  International Cuisine-Spotlight on the [employer] Zones  Delicious Desserts  Savory Soups  [employer] - Meals in a Snap  Tasty Appetizers and Snacks  Comforting Weekend Breakfasts  One-Pot Wonders   Fast Evening Meals  Landscape architect Your Pritikin Plate  WORKSHOPS   Healthy Mindset (Psychosocial):  Focused Goals, Sustainable Changes Clinical staff led group instruction and group discussion with PowerPoint  presentation and patient guidebook. To enhance the learning environment the use of posters, models and videos may be added. Patients will be able to apply effective goal setting strategies to establish at least one personal goal, and then take consistent, meaningful action toward that goal. They will learn to identify common barriers to achieving personal goals and develop strategies to overcome them. Patients will also gain an understanding of how our mind-set can impact our ability to achieve goals and the importance of cultivating a positive and growth-oriented mind-set. The purpose of this lesson is to provide patients with a deeper understanding of how to set and achieve personal goals, as well as the tools and strategies needed to overcome common obstacles which may arise along the way.  From Head to Heart: The Power of a Healthy Outlook  Clinical staff led group instruction and group discussion with PowerPoint presentation and patient guidebook. To enhance the learning environment the use of posters, models and videos may be added. Patients will be able to recognize and describe the impact of emotions and mood on physical health. They will discover the importance of self-care and explore self-care practices which may work for them. Patients will also learn how to utilize the 4 C's to cultivate a healthier outlook and better manage stress and challenges. The purpose of this lesson is to demonstrate to patients how a healthy outlook is an essential part of maintaining good health, especially as they continue their cardiac rehab journey.  Healthy Sleep for a Healthy Heart Clinical staff led group instruction and group discussion with PowerPoint presentation and patient guidebook. To enhance the learning environment the use of posters, models and videos may be added. At the conclusion of this workshop, patients will be able to demonstrate knowledge of the importance of sleep to overall health, well-being,  and quality of life. They will understand the symptoms of, and treatments for, common sleep disorders. Patients will also be able to identify daytime and nighttime behaviors which impact sleep, and they will be able to apply these tools to help manage sleep-related challenges. The purpose of this lesson is to provide patients with a general overview of sleep and outline the importance of quality sleep. Patients will learn about a few of the most common sleep disorders. Patients will also be introduced to the concept of "sleep hygiene," and discover ways to self-manage certain sleeping problems through simple daily behavior changes. Finally, the workshop will motivate patients by clarifying the links between quality sleep and their goals of heart-healthy living.   Recognizing and Reducing Stress Clinical staff led group instruction and group discussion with PowerPoint presentation and patient guidebook. To enhance the learning environment the use of posters, models and videos may be added. At the conclusion of this workshop, patients will be able to understand the types of stress reactions, differentiate between acute  and chronic stress, and recognize the impact that chronic stress has on their health. They will also be able to apply different coping mechanisms, such as reframing negative self-talk. Patients will have the opportunity to practice a variety of stress management techniques, such as deep abdominal breathing, progressive muscle relaxation, and/or guided imagery.  The purpose of this lesson is to educate patients on the role of stress in their lives and to provide healthy techniques for coping with it.  Learning Barriers/Preferences:  Learning Barriers/Preferences - 07/10/24 1243       Learning Barriers/Preferences   Learning Barriers Sight   wears glasses   Learning Preferences Video;Pictoral          Education Topics:  Knowledge Questionnaire Score:  Knowledge Questionnaire Score -  07/10/24 1234       Knowledge Questionnaire Score   Pre Score 24/24          Core Components/Risk Factors/Patient Goals at Admission:  Personal Goals and Risk Factors at Admission - 07/10/24 1144       Core Components/Risk Factors/Patient Goals on Admission   Lipids Yes    Intervention Provide education and support for participant on nutrition & aerobic/resistive exercise along with prescribed medications to achieve LDL 70mg , HDL >40mg .    Expected Outcomes Short Term: Participant states understanding of desired cholesterol values and is compliant with medications prescribed. Participant is following exercise prescription and nutrition guidelines.;Long Term: Cholesterol controlled with medications as prescribed, with individualized exercise RX and with personalized nutrition plan. Value goals: LDL < 70mg , HDL > 40 mg.          Core Components/Risk Factors/Patient Goals Review:    Core Components/Risk Factors/Patient Goals at Discharge (Final Review):    ITP Comments:  ITP Comments     Row Name 07/10/24 1030           ITP Comments Medical Director- Dr. Wilbert Bihari, MD. Pritikin Education Program/ Intensive Cardiac Rehab Program. Initial orientation folder reviewed with Sansa.          Comments: Participant attended orientation for the cardiac rehabilitation program on  07/10/2024  to perform initial intake and exercise walk test. Patient introduced to the Pritikin Program education and orientation packet was reviewed. Completed 6-minute walk test, measurements, initial ITP, and exercise prescription. Vital signs stable. Telemetry-normal sinus rhythm, asymptomatic.   Service time was from 1030 to 1206.    Arnoldo CHRISTELLA Gal, MS, ACSM CEP 07/10/2024 1256

## 2024-07-16 ENCOUNTER — Encounter (HOSPITAL_COMMUNITY)
Admission: RE | Admit: 2024-07-16 | Discharge: 2024-07-16 | Disposition: A | Discharge status: Home / Self Care | Source: Ambulatory Visit | Attending: Cardiology

## 2024-07-16 DIAGNOSIS — I252 Old myocardial infarction: Secondary | ICD-10-CM | POA: Diagnosis not present

## 2024-07-16 DIAGNOSIS — Z48812 Encounter for surgical aftercare following surgery on the circulatory system: Secondary | ICD-10-CM | POA: Diagnosis not present

## 2024-07-16 DIAGNOSIS — I213 ST elevation (STEMI) myocardial infarction of unspecified site: Secondary | ICD-10-CM

## 2024-07-16 DIAGNOSIS — Z955 Presence of coronary angioplasty implant and graft: Secondary | ICD-10-CM | POA: Diagnosis not present

## 2024-07-16 NOTE — Progress Notes (Signed)
 Daily Session Note  Patient Details  Name: Maria Zamora MRN: [id number] Date of Birth: January 03, 1961 Referring Provider:   Flowsheet Row INTENSIVE CARDIAC REHAB ORIENT from 07/10/2024 in [facility]  Referring Provider Ladona Heinz, MD    Encounter Date: 07/16/2024  Check In:  Session Check In - 07/16/24 1513       Check-In   Supervising physician immediately available to respond to emergencies [facility] MD immediately available    Physician(s) Rosaline Bane, NP    Location [facility]    Staff Present Arnoldo Gal, MS, ACSM-CEP, Exercise Physiologist;Bailey Elnor, MS, Exercise Physiologist;Johnny Fayette, MS, Exercise Physiologist;Jetta Vannie BS, ACSM-CEP, Exercise Physiologist;Mikeisha Lemonds Lennon, RN, Mallory Parkins, MS, ACSM-CEP, CCRP, Exercise Physiologist    Virtual Visit No    Medication changes reported     No    Fall or balance concerns reported    No    Tobacco Cessation No Change    Warm-up and Cool-down Performed as group-led instruction    Resistance Training Performed Yes    VAD Patient? No    PAD/SET Patient? No      Pain Assessment   Currently in Pain? No/denies    Pain Score 0-No pain    Multiple Pain Sites No          Capillary Blood Glucose: No results found for this or any previous visit (from the past 24 hours).   Exercise Prescription Changes - 07/16/24 1638       Response to Exercise   Blood Pressure (Admit) 110/60    Blood Pressure (Exercise) 128/78    Blood Pressure (Exit) 100/60    Heart Rate (Admit) 67 bpm    Heart Rate (Exercise) 101 bpm    Heart Rate (Exit) 84 bpm    Rating of Perceived Exertion (Exercise) 10    Perceived Dyspnea (Exercise) 0    Symptoms none    Comments Patient first day in the [facility] program    Duration Progress to 30 minutes of  aerobic without signs/symptoms of physical distress    Intensity THRR unchanged      Progression    Progression Continue to progress workloads to maintain intensity without signs/symptoms of physical distress.    Average METs 2.3      Resistance Training   Training Prescription Yes    Weight 2 lbs    Reps 10-15    Time 10 Minutes      Bike   Level 1    Watts 18    Minutes 15    METs 2.8      NuStep   Level 1    SPM 62    Minutes 15    METs 1.8          Social History   Tobacco Use  Smoking Status Never  Smokeless Tobacco Never    Goals Met:  Exercise tolerated well No report of concerns or symptoms today Strength training completed today  Goals Unmet:  Not Applicable  Comments: Pt started cardiac rehab today.  Pt tolerated light exercise without difficulty. VSS, telemetry-sinus rhythm. asymptomatic.  Medication list reconciled. Pt denies barriers to medication compliance.  PSYCHOSOCIAL ASSESSMENT:  PHQ-2. Pt exhibits positive coping skills, hopeful outlook with supportive family. No psychosocial needs identified at this time, no psychosocial interventions necessary.    Pt enjoys reading.   Pt oriented to exercise equipment and routine.    Understanding verbalized.  Dr. Wilbert Bihari is Medical Director for Cardiac Rehab at [facility].

## 2024-07-18 ENCOUNTER — Encounter (HOSPITAL_COMMUNITY)
Admission: RE | Admit: 2024-07-18 | Discharge: 2024-07-18 | Disposition: A | Discharge status: Home / Self Care | Source: Ambulatory Visit | Attending: Cardiology | Admitting: Cardiology

## 2024-07-18 ENCOUNTER — Ambulatory Visit: Admitting: Nurse Practitioner

## 2024-07-18 DIAGNOSIS — I252 Old myocardial infarction: Secondary | ICD-10-CM | POA: Diagnosis not present

## 2024-07-18 DIAGNOSIS — Z48812 Encounter for surgical aftercare following surgery on the circulatory system: Secondary | ICD-10-CM | POA: Diagnosis not present

## 2024-07-18 DIAGNOSIS — I213 ST elevation (STEMI) myocardial infarction of unspecified site: Secondary | ICD-10-CM

## 2024-07-18 DIAGNOSIS — Z955 Presence of coronary angioplasty implant and graft: Secondary | ICD-10-CM | POA: Diagnosis not present

## 2024-07-20 ENCOUNTER — Encounter (HOSPITAL_COMMUNITY)
Admission: RE | Admit: 2024-07-20 | Discharge: 2024-07-20 | Disposition: A | Discharge status: Home / Self Care | Source: Ambulatory Visit | Attending: Cardiology | Admitting: Cardiology

## 2024-07-20 DIAGNOSIS — I213 ST elevation (STEMI) myocardial infarction of unspecified site: Secondary | ICD-10-CM

## 2024-07-20 DIAGNOSIS — I252 Old myocardial infarction: Secondary | ICD-10-CM | POA: Diagnosis not present

## 2024-07-20 DIAGNOSIS — Z955 Presence of coronary angioplasty implant and graft: Secondary | ICD-10-CM

## 2024-07-20 DIAGNOSIS — Z48812 Encounter for surgical aftercare following surgery on the circulatory system: Secondary | ICD-10-CM | POA: Diagnosis not present

## 2024-07-25 ENCOUNTER — Encounter (HOSPITAL_COMMUNITY)
Admission: RE | Admit: 2024-07-25 | Discharge: 2024-07-25 | Disposition: A | Discharge status: Home / Self Care | Source: Ambulatory Visit | Attending: Cardiology | Admitting: Cardiology

## 2024-07-25 DIAGNOSIS — I213 ST elevation (STEMI) myocardial infarction of unspecified site: Secondary | ICD-10-CM

## 2024-07-25 DIAGNOSIS — Z955 Presence of coronary angioplasty implant and graft: Secondary | ICD-10-CM | POA: Diagnosis not present

## 2024-07-25 DIAGNOSIS — I252 Old myocardial infarction: Secondary | ICD-10-CM | POA: Insufficient documentation

## 2024-07-25 DIAGNOSIS — Z48812 Encounter for surgical aftercare following surgery on the circulatory system: Secondary | ICD-10-CM | POA: Diagnosis not present

## 2024-07-26 DIAGNOSIS — Z0000 Encounter for general adult medical examination without abnormal findings: Secondary | ICD-10-CM | POA: Diagnosis not present

## 2024-07-27 ENCOUNTER — Encounter (HOSPITAL_COMMUNITY)
Admission: RE | Admit: 2024-07-27 | Discharge: 2024-07-27 | Disposition: A | Discharge status: Home / Self Care | Source: Ambulatory Visit | Attending: Cardiology | Admitting: Cardiology

## 2024-07-27 DIAGNOSIS — Z48812 Encounter for surgical aftercare following surgery on the circulatory system: Secondary | ICD-10-CM | POA: Diagnosis not present

## 2024-07-27 DIAGNOSIS — I213 ST elevation (STEMI) myocardial infarction of unspecified site: Secondary | ICD-10-CM

## 2024-07-27 DIAGNOSIS — Z955 Presence of coronary angioplasty implant and graft: Secondary | ICD-10-CM

## 2024-07-30 ENCOUNTER — Encounter (HOSPITAL_COMMUNITY)
Admission: RE | Admit: 2024-07-30 | Discharge: 2024-07-30 | Disposition: A | Discharge status: Home / Self Care | Source: Ambulatory Visit | Attending: Cardiology

## 2024-07-30 DIAGNOSIS — Z48812 Encounter for surgical aftercare following surgery on the circulatory system: Secondary | ICD-10-CM | POA: Diagnosis not present

## 2024-07-30 DIAGNOSIS — I213 ST elevation (STEMI) myocardial infarction of unspecified site: Secondary | ICD-10-CM

## 2024-07-30 DIAGNOSIS — Z955 Presence of coronary angioplasty implant and graft: Secondary | ICD-10-CM

## 2024-07-31 NOTE — Progress Notes (Signed)
 Cardiac Individual Treatment Plan  Patient Details  Name: Maria Zamora MRN: [id number] Date of Birth: [birth year] Referring Provider:   Flowsheet Row INTENSIVE CARDIAC REHAB ORIENT from 07/10/2024 in [facility]  Referring Provider Ladona Heinz, MD    Initial Encounter Date:  Flowsheet Row INTENSIVE CARDIAC REHAB ORIENT from 07/10/2024 in [facility] Heart, Vascular, & Lung Health  Date 07/10/24    Visit Diagnosis: 06/05/24 STEMI  06/05/24 DES LAD, D1, PTA  Patient's Home Medications on Admission:  Current Outpatient Medications:    aspirin  EC 81 MG tablet, Take 1 tablet (81 mg total) by mouth daily. Swallow whole., Disp: 120 tablet, Rfl: 2   atorvastatin  (LIPITOR ) 80 MG tablet, Take 1 tablet (80 mg total) by mouth daily., Disp: 30 tablet, Rfl: 11   cetirizine (ZYRTEC) 10 MG tablet, Take 10 mg by mouth daily as needed for allergies (seasonal)., Disp: , Rfl:    dapagliflozin  propanediol (FARXIGA ) 10 MG TABS tablet, Take 1 tablet (10 mg total) by mouth daily., Disp: 30 tablet, Rfl: 11   fexofenadine (ALLEGRA) 180 MG tablet, Take 180 mg by mouth daily as needed for allergies or rhinitis (Seasonal)., Disp: , Rfl:    metoprolol  succinate (TOPROL -XL) 50 MG 24 hr tablet, Take 1 tablet (50 mg total) by mouth at bedtime., Disp: 30 tablet, Rfl: 3   Multiple Vitamin (MULTIVITAMIN WITH MINERALS) TABS tablet, Take 1 tablet by mouth daily., Disp: , Rfl:    nitroGLYCERIN  (NITROSTAT ) 0.4 MG SL tablet, Place 1 tablet (0.4 mg total) under the tongue every 5 (five) minutes as needed for chest pain., Disp: 25 tablet, Rfl: 3   sacubitril -valsartan  (ENTRESTO ) 49-51 MG, Take 1 tablet by mouth 2 (two) times daily., Disp: 60 tablet, Rfl: 2   spironolactone  (ALDACTONE ) 25 MG tablet, Take 1 tablet (25 mg total) by mouth at bedtime., Disp: 30 tablet, Rfl: 3   ticagrelor  (BRILINTA ) 90 MG TABS tablet, Take 1 tablet (90 mg total)  by mouth 2 (two) times daily., Disp: 60 tablet, Rfl: 11  Past Medical History: Past Medical History:  Diagnosis Date   Coronary artery disease    Hyperlipidemia    MI (myocardial infarction) (HCC)     Tobacco Use: Social History   Tobacco Use  Smoking Status Never  Smokeless Tobacco Never    Labs: Review Flowsheet  More data exists      Latest Ref Rng & Units 06/13/2015 06/16/2016 06/05/2024 06/17/2024 07/03/2024  Labs for ITP Cardiac and Pulmonary Rehab  Cholestrol 100 - 199 mg/dL 794  789  774  - 878   LDL (calc) 0 - 99 mg/dL 71  889  866  - 50   HDL-C >39 mg/dL 61  73  47  - 44   Trlycerides 0 - 149 mg/dL 635  866  774  - 840   Hemoglobin A1c 4.8 - 5.6 % - - 5.2  - -  TCO2 22 - 32 mmol/L - - - 26  -    Capillary Blood Glucose: No results found for: GLUCAP   Exercise Target Goals: Exercise Program Goal: Individual exercise prescription set using results from initial 6 min walk test and THRR while considering  patient's activity barriers and safety.   Exercise Prescription Goal: Initial exercise prescription builds to 30-45 minutes a day of aerobic activity, 2-3 days per week.  Home exercise guidelines will be given to patient during program as part of exercise prescription that  the participant will acknowledge.  Activity Barriers & Risk Stratification:  Activity Barriers & Cardiac Risk Stratification - 07/10/24 1144       Activity Barriers & Cardiac Risk Stratification   Activity Barriers Other (comment)    Comments Low ejection fraction    Cardiac Risk Stratification High          6 Minute Walk:  6 Minute Walk     Row Name 07/10/24 1154         6 Minute Walk   Phase Initial     Distance 1566 feet     Walk Time 6 minutes     # of Rest Breaks 0     MPH 2.96     METS 3.82     RPE 9     Perceived Dyspnea  0     VO2 Peak 13.37     Symptoms No     Resting HR 77 bpm     Resting BP 102/64     Resting Oxygen Saturation  98 %     Exercise Oxygen  Saturation  during 6 min walk 98 %     Max Ex. HR 101 bpm     Max Ex. BP 100/60     2 Minute Post BP 102/60        Oxygen Initial Assessment:   Oxygen Re-Evaluation:   Oxygen Discharge (Final Oxygen Re-Evaluation):   Initial Exercise Prescription:  Initial Exercise Prescription - 07/10/24 1200       Date of Initial Exercise RX and Referring Provider   Date 07/10/24    Referring Provider Ladona Heinz, MD    Expected Discharge Date 10/03/24      Bike   Level 1    Minutes 15    METs 2.5      NuStep   Level 1    SPM 85    Minutes 15    METs 2.5      Prescription Details   Frequency (times per week) 3    Duration Progress to 30 minutes of continuous aerobic without signs/symptoms of physical distress      Intensity   THRR 40-80% of Max Heartrate 64-128    Ratings of Perceived Exertion 11-13    Perceived Dyspnea 0-4      Progression   Progression Continue to progress workloads to maintain intensity without signs/symptoms of physical distress.      Resistance Training   Training Prescription Yes    Weight 2 lbs    Reps 10-15          Perform Capillary Blood Glucose checks as needed.  Exercise Prescription Changes:   Exercise Prescription Changes     Row Name 07/16/24 1638             Response to Exercise   Blood Pressure (Admit) 110/60       Blood Pressure (Exercise) 128/78       Blood Pressure (Exit) 100/60       Heart Rate (Admit) 67 bpm       Heart Rate (Exercise) 101 bpm       Heart Rate (Exit) 84 bpm       Rating of Perceived Exertion (Exercise) 10       Perceived Dyspnea (Exercise) 0       Symptoms none       Comments Patient first day in the Pritikin ICR program       Duration Progress to 30 minutes of  aerobic without  signs/symptoms of physical distress       Intensity THRR unchanged         Progression   Progression Continue to progress workloads to maintain intensity without signs/symptoms of physical distress.       Average METs  2.3         Resistance Training   Training Prescription Yes       Weight 2 lbs       Reps 10-15       Time 10 Minutes         Bike   Level 1       Watts 18       Minutes 15       METs 2.8         NuStep   Level 1       SPM 62       Minutes 15       METs 1.8          Exercise Comments:   Exercise Comments     Row Name 07/16/24 1645           Exercise Comments Pt first day in the Pritikin ICR program. Pt tolerated exercise well with an average MET level of 2.3. Pt is off to a good start and is learning her THRR, RPE and ExRx          Exercise Goals and Review:   Exercise Goals     Row Name 07/10/24 1144             Exercise Goals   Increase Physical Activity Yes       Intervention Provide advice, education, support and counseling about physical activity/exercise needs.;Develop an individualized exercise prescription for aerobic and resistive training based on initial evaluation findings, risk stratification, comorbidities and participant's personal goals.       Expected Outcomes Short Term: Attend rehab on a regular basis to increase amount of physical activity.;Long Term: Exercising regularly at least 3-5 days a week.;Long Term: Add in home exercise to make exercise part of routine and to increase amount of physical activity.       Increase Strength and Stamina Yes       Intervention Provide advice, education, support and counseling about physical activity/exercise needs.;Develop an individualized exercise prescription for aerobic and resistive training based on initial evaluation findings, risk stratification, comorbidities and participant's personal goals.       Expected Outcomes Short Term: Increase workloads from initial exercise prescription for resistance, speed, and METs.;Short Term: Perform resistance training exercises routinely during rehab and add in resistance training at home;Long Term: Improve cardiorespiratory fitness, muscular endurance and strength  as measured by increased METs and functional capacity ( )       Able to understand and use rate of perceived exertion (RPE) scale Yes       Intervention Provide education and explanation on how to use RPE scale       Expected Outcomes Short Term: Able to use RPE daily in rehab to express subjective intensity level;Long Term:  Able to use RPE to guide intensity level when exercising independently       Knowledge and understanding of Target Heart Rate Range (THRR) Yes       Intervention Provide education and explanation of THRR including how the numbers were predicted and where they are located for reference       Expected Outcomes Short Term: Able to state/look up THRR;Long Term: Able to use THRR to govern  intensity when exercising independently;Short Term: Able to use daily as guideline for intensity in rehab       Able to check pulse independently Yes       Intervention Provide education and demonstration on how to check pulse in carotid and radial arteries.;Review the importance of being able to check your own pulse for safety during independent exercise       Expected Outcomes Short Term: Able to explain why pulse checking is important during independent exercise;Long Term: Able to check pulse independently and accurately       Understanding of Exercise Prescription Yes       Intervention Provide education, explanation, and written materials on patient's individual exercise prescription       Expected Outcomes Short Term: Able to explain program exercise prescription;Long Term: Able to explain home exercise prescription to exercise independently          Exercise Goals Re-Evaluation :  Exercise Goals Re-Evaluation     Row Name 07/16/24 1642             Exercise Goal Re-Evaluation   Exercise Goals Review Increase Physical Activity;Understanding of Exercise Prescription;Increase Strength and Stamina;Knowledge and understanding of Target Heart Rate Range (THRR);Able to understand and use  rate of perceived exertion (RPE) scale       Comments Pt first day in the Pritikin ICR program. Pt tolerated exercise well with an average MET level of 2.3. Pt is off to a good start and is learning her THRR, RPE and ExRx       Expected Outcomes Will continue to monitor pt and progress workloads as tolerated without sign or symptom          Discharge Exercise Prescription (Final Exercise Prescription Changes):  Exercise Prescription Changes - 07/16/24 1638       Response to Exercise   Blood Pressure (Admit) 110/60    Blood Pressure (Exercise) 128/78    Blood Pressure (Exit) 100/60    Heart Rate (Admit) 67 bpm    Heart Rate (Exercise) 101 bpm    Heart Rate (Exit) 84 bpm    Rating of Perceived Exertion (Exercise) 10    Perceived Dyspnea (Exercise) 0    Symptoms none    Comments Patient first day in the Pritikin ICR program    Duration Progress to 30 minutes of  aerobic without signs/symptoms of physical distress    Intensity THRR unchanged      Progression   Progression Continue to progress workloads to maintain intensity without signs/symptoms of physical distress.    Average METs 2.3      Resistance Training   Training Prescription Yes    Weight 2 lbs    Reps 10-15    Time 10 Minutes      Bike   Level 1    Watts 18    Minutes 15    METs 2.8      NuStep   Level 1    SPM 62    Minutes 15    METs 1.8          Nutrition:  Target Goals: Understanding of nutrition guidelines, daily intake of sodium 1500mg , cholesterol 200mg , calories 30% from fat and 7% or less from saturated fats, daily to have 5 or more servings of fruits and vegetables.  Biometrics:  Pre Biometrics - 07/10/24 1030       Pre Biometrics   Waist Circumference 32 inches    Hip Circumference 41.75 inches    Waist to  Hip Ratio 0.77 %    Triceps Skinfold 31 mm    % Body Fat 36 %    Grip Strength 16 kg    Flexibility 14.88 in    Single Leg Stand 28.18 seconds           Nutrition Therapy  Plan and Nutrition Goals:   Nutrition Assessments:  Nutrition Assessments - 07/18/24 1649       Rate Your Plate Scores   Pre Score 70         MEDIFICTS Score Key: >=70 Need to make dietary changes  40-70 Heart Healthy Diet <= 40 Therapeutic Level Cholesterol Diet   Flowsheet Row INTENSIVE CARDIAC REHAB from 07/18/2024 in [facility] for Heart, Vascular, & Lung Health  Picture Your Plate Total Score on Admission 70   Picture Your Plate Scores: <59 Unhealthy dietary pattern with much room for improvement. 41-50 Dietary pattern unlikely to meet recommendations for good health and room for improvement. 51-60 More healthful dietary pattern, with some room for improvement.  >60 Healthy dietary pattern, although there may be some specific behaviors that could be improved.    Nutrition Goals Re-Evaluation:   Nutrition Goals Re-Evaluation:   Nutrition Goals Discharge (Final Nutrition Goals Re-Evaluation):   Psychosocial: Target Goals: Acknowledge presence or absence of significant depression and/or stress, maximize coping skills, provide positive support system. Participant is able to verbalize types and ability to use techniques and skills needed for reducing stress and depression.  Initial Review & Psychosocial Screening:  Initial Psych Review & Screening - 07/10/24 1235       Initial Review   Current issues with Current Stress Concerns    Source of Stress Concerns Chronic Illness;Unable to perform yard/household activities    Comments Lalonnie repots that she has frequent urination now that she is taking a diuretic. Discussed sleep hygiene with the patient and her husband      Family Dynamics   Good Support System? Yes   Eily has her husband, daughter, mother and firends for support. Mckinzee has a good support network     Barriers   Psychosocial barriers to participate in program The patient should benefit from training in stress management and  relaxation.      Screening Interventions   Interventions Encouraged to exercise;To provide support and resources with identified psychosocial needs;Provide feedback about the scores to participant    Expected Outcomes Long Term Goal: Stressors or current issues are controlled or eliminated.;Short Term goal: Identification and review with participant of any Quality of Life or Depression concerns found by scoring the questionnaire.;Long Term goal: The participant improves quality of Life and PHQ9 Scores as seen by post scores and/or verbalization of changes          Quality of Life Scores:  Quality of Life - 07/10/24 1234       Quality of Life   Select Quality of Life      Quality of Life Scores   Health/Function Pre 25.2 %    Socioeconomic Pre 28.21 %    Psych/Spiritual Pre 26.57 %    Family Pre 30 %    GLOBAL Pre 26.81 %         Scores of 19 and below usually indicate a poorer quality of life in these areas.  A difference of  2-3 points is a clinically meaningful difference.  A difference of 2-3 points in the total score of the Quality of Life Index has been associated with significant improvement  in overall quality of life, self-image, physical symptoms, and general health in studies assessing change in quality of life.  PHQ-9: Review Flowsheet       07/10/2024 03/15/2024  Depression screen PHQ 2/9  Decreased Interest 0 0  Down, Depressed, Hopeless 0 0  PHQ - 2 Score 0 0  Altered sleeping 1 -  Tired, decreased energy 0 -  Change in appetite 0 -  Feeling bad or failure about yourself  0 -  Trouble concentrating 1 -  Moving slowly or fidgety/restless 0 -  Suicidal thoughts 0 -  PHQ-9 Score 2 -  Difficult doing work/chores Not difficult at all -   Interpretation of Total Score  Total Score Depression Severity:  1-4 = Minimal depression, 5-9 = Mild depression, 10-14 = Moderate depression, 15-19 = Moderately severe depression, 20-27 = Severe depression   Psychosocial  Evaluation and Intervention:   Psychosocial Re-Evaluation:  Psychosocial Re-Evaluation     Row Name 07/31/24 1621             Psychosocial Re-Evaluation   Current issues with Current Stress Concerns       Comments Allissa did discuss that her life vest was going off and she has had it resized as she has lost a lot of weight       Expected Outcomes Jerine will have contolled or decreased stressors upon completion of cardiac rehab.       Interventions Stress management education;Encouraged to attend Cardiac Rehabilitation for the exercise;Relaxation education       Continue Psychosocial Services  Follow up required by staff         Initial Review   Source of Stress Concerns Chronic Illness       Comments Will continue to monitor and offer support as needed          Psychosocial Discharge (Final Psychosocial Re-Evaluation):  Psychosocial Re-Evaluation - 07/31/24 1621       Psychosocial Re-Evaluation   Current issues with Current Stress Concerns    Comments Tiwanda did discuss that her life vest was going off and she has had it resized as she has lost a lot of weight    Expected Outcomes Genea will have contolled or decreased stressors upon completion of cardiac rehab.    Interventions Stress management education;Encouraged to attend Cardiac Rehabilitation for the exercise;Relaxation education    Continue Psychosocial Services  Follow up required by staff      Initial Review   Source of Stress Concerns Chronic Illness    Comments Will continue to monitor and offer support as needed          Vocational Rehabilitation: Provide vocational rehab assistance to qualifying candidates.   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 Participant will state understanding/return demonstration of topics presented.    Education     Row Name 07/16/24 1500     Education   Cardiac Education Topics Pritikin   Geographical information systems officer Exercise   Exercise Workshop Exercise Basics: Building Your Action Plan   Instruction Review Code  1- Verbalizes Understanding   Class Start Time 1420   Class Stop Time 1500   Class Time Calculation (min) 40 min    Row Name 07/18/24 1500     Education   Cardiac Education Topics Pritikin   Customer service manager   Weekly Topic Efficiency Cooking - Meals in a Snap   Instruction Review Code 1- Verbalizes Understanding   Class Start Time 1400   Class Stop Time 1440   Class Time Calculation (min) 40 min    Row Name 07/20/24 1400     Education   Cardiac Education Topics Pritikin   Psychologist, forensic Exercise Education   Exercise Education Move It!   Instruction Review Code 1- Verbalizes Understanding   Class Start Time 1359   Class Stop Time 1432   Class Time Calculation (min) 33 min    Row Name 07/25/24 1400     Education   Cardiac Education Topics Pritikin   Customer service manager   Weekly Topic One-Pot Wonders   Instruction Review Code 1- Verbalizes Understanding   Class Start Time 1400   Class Stop Time 1440   Class Time Calculation (min) 40 min    Row Name 07/27/24 1500     Education   Cardiac Education Topics Pritikin   Writer General Education   General Education Hypertension and Heart Disease   Instruction Review Code 1- Verbalizes Understanding   Class Start Time 1403   Class Stop Time 1440   Class  Time Calculation (min) 37 min    Row Name 07/30/24 1500     Education   Cardiac Education Topics Pritikin   Geographical information systems officer Psychosocial   Psychosocial Workshop Focused Goals, Sustainable Changes   Instruction Review Code 1- Verbalizes Understanding   Class Start Time 1359   Class Stop Time 1434   Class Time Calculation (min) 35 min      Core Videos: Exercise    Move It!  Clinical staff conducted group or individual video education with verbal and written material and guidebook.  Patient learns the recommended Pritikin exercise program. Exercise with the goal of living a long, healthy life. Some of the health benefits of exercise include controlled diabetes, healthier blood pressure levels, improved cholesterol levels, improved heart and lung capacity, improved sleep, and better body composition. Everyone should speak with their doctor before starting or changing an exercise routine.  Biomechanical Limitations Clinical staff conducted group or individual video education with verbal and written material and guidebook.  Patient learns how biomechanical limitations can impact exercise and how we can mitigate and possibly overcome limitations to have an impactful and balanced exercise routine.  Body Composition Clinical staff conducted group or individual video education with verbal and written material and guidebook.  Patient learns that body composition (ratio of muscle mass to fat mass) is a key component to assessing overall fitness, rather than body weight alone. Increased fat mass, especially visceral belly fat, can put us  at increased risk for metabolic syndrome, type 2 diabetes, heart disease, and even death. It is recommended to combine diet and exercise (cardiovascular and resistance training) to improve your body composition. Seek  guidance from your physician and exercise physiologist before implementing an exercise  routine.  Exercise Action Plan Clinical staff conducted group or individual video education with verbal and written material and guidebook.  Patient learns the recommended strategies to achieve and enjoy long-term exercise adherence, including variety, self-motivation, self-efficacy, and positive decision making. Benefits of exercise include fitness, good health, weight management, more energy, better sleep, less stress, and overall well-being.  Medical   Heart Disease Risk Reduction Clinical staff conducted group or individual video education with verbal and written material and guidebook.  Patient learns our heart is our most vital organ as it circulates oxygen, nutrients, white blood cells, and hormones throughout the entire body, and carries waste away. Data supports a plant-based eating plan like the Pritikin Program for its effectiveness in slowing progression of and reversing heart disease. The video provides a number of recommendations to address heart disease.   Metabolic Syndrome and Belly Fat  Clinical staff conducted group or individual video education with verbal and written material and guidebook.  Patient learns what metabolic syndrome is, how it leads to heart disease, and how one can reverse it and keep it from coming back. You have metabolic syndrome if you have 3 of the following 5 criteria: abdominal obesity, high blood pressure, high triglycerides, low HDL cholesterol, and high blood sugar.  Hypertension and Heart Disease Clinical staff conducted group or individual video education with verbal and written material and guidebook.  Patient learns that high blood pressure, or hypertension, is very common in the [place] . Hypertension is largely due to excessive salt intake, but other important risk factors include being overweight, physical inactivity, drinking too much alcohol, smoking, and not eating enough potassium from fruits and vegetables. High blood pressure is a  leading risk factor for heart attack, stroke, congestive heart failure, dementia, kidney failure, and premature death. Long-term effects of excessive salt intake include stiffening of the arteries and thickening of heart muscle and organ damage. Recommendations include ways to reduce hypertension and the risk of heart disease.  Diseases of Our Time - Focusing on Diabetes Clinical staff conducted group or individual video education with verbal and written material and guidebook.  Patient learns why the best way to stop diseases of our time is prevention, through food and other lifestyle changes. Medicine (such as prescription pills and surgeries) is often only a Band-Aid on the problem, not a long-term solution. Most common diseases of our time include obesity, type 2 diabetes, hypertension, heart disease, and cancer. The Pritikin Program is recommended and has been proven to help reduce, reverse, and/or prevent the damaging effects of metabolic syndrome.  Nutrition   Overview of the Pritikin Eating Plan  Clinical staff conducted group or individual video education with verbal and written material and guidebook.  Patient learns about the Pritikin Eating Plan for disease risk reduction. The Pritikin Eating Plan emphasizes a wide variety of unrefined, minimally-processed carbohydrates, like fruits, vegetables, whole grains, and legumes. Go, Caution, and Stop food choices are explained. Plant-based and lean animal proteins are emphasized. Rationale provided for low sodium intake for blood pressure control, low added sugars for blood sugar stabilization, and low added fats and oils for coronary artery disease risk reduction and weight management.  Calorie Density  Clinical staff conducted group or individual video education with verbal and written material and guidebook.  Patient learns about calorie density and how it impacts the Pritikin Eating Plan. Knowing the characteristics of the food you choose will  help you  decide whether those foods will lead to weight gain or weight loss, and whether you want to consume more or less of them. Weight loss is usually a side effect of the Pritikin Eating Plan because of its focus on low calorie-dense foods.  Label Reading  Clinical staff conducted group or individual video education with verbal and written material and guidebook.  Patient learns about the Pritikin recommended label reading guidelines and corresponding recommendations regarding calorie density, added sugars, sodium content, and whole grains.  Dining Out - Part 1  Clinical staff conducted group or individual video education with verbal and written material and guidebook.  Patient learns that restaurant meals can be sabotaging because they can be so high in calories, fat, sodium, and/or sugar. Patient learns recommended strategies on how to positively address this and avoid unhealthy pitfalls.  Facts on Fats  Clinical staff conducted group or individual video education with verbal and written material and guidebook.  Patient learns that lifestyle modifications can be just as effective, if not more so, as many medications for lowering your risk of heart disease. A Pritikin lifestyle can help to reduce your risk of inflammation and atherosclerosis (cholesterol build-up, or plaque, in the artery walls). Lifestyle interventions such as dietary choices and physical activity address the cause of atherosclerosis. A review of the types of fats and their impact on blood cholesterol levels, along with dietary recommendations to reduce fat intake is also included.  Nutrition Action Plan  Clinical staff conducted group or individual video education with verbal and written material and guidebook.  Patient learns how to incorporate Pritikin recommendations into their lifestyle. Recommendations include planning and keeping personal health goals in mind as an important part of their success.  Healthy Mind-Set     Healthy Minds, Bodies, Hearts  Clinical staff conducted group or individual video education with verbal and written material and guidebook.  Patient learns how to identify when they are stressed. Video will discuss the impact of that stress, as well as the many benefits of stress management. Patient will also be introduced to stress management techniques. The way we think, act, and feel has an impact on our hearts.  How Our Thoughts Can Heal Our Hearts  Clinical staff conducted group or individual video education with verbal and written material and guidebook.  Patient learns that negative thoughts can cause depression and anxiety. This can result in negative lifestyle behavior and serious health problems. Cognitive behavioral therapy is an effective method to help control our thoughts in order to change and improve our emotional outlook.  Additional Videos:  Exercise    Improving Performance  Clinical staff conducted group or individual video education with verbal and written material and guidebook.  Patient learns to use a non-linear approach by alternating intensity levels and lengths of time spent exercising to help burn more calories and lose more body fat. Cardiovascular exercise helps improve heart health, metabolism, hormonal balance, blood sugar control, and recovery from fatigue. Resistance training improves strength, endurance, balance, coordination, reaction time, metabolism, and muscle mass. Flexibility exercise improves circulation, posture, and balance. Seek guidance from your physician and exercise physiologist before implementing an exercise routine and learn your capabilities and proper form for all exercise.  Introduction to Yoga  Clinical staff conducted group or individual video education with verbal and written material and guidebook.  Patient learns about yoga, a discipline of the coming together of mind, breath, and body. The benefits of yoga include improved flexibility,  improved range of motion, better  posture and core strength, increased lung function, weight loss, and positive self-image. Yoga's heart health benefits include lowered blood pressure, healthier heart rate, decreased cholesterol and triglyceride levels, improved immune function, and reduced stress. Seek guidance from your physician and exercise physiologist before implementing an exercise routine and learn your capabilities and proper form for all exercise.  Medical   Aging: Enhancing Your Quality of Life  Clinical staff conducted group or individual video education with verbal and written material and guidebook.  Patient learns key strategies and recommendations to stay in good physical health and enhance quality of life, such as prevention strategies, having an advocate, securing a Health Care Proxy and Power of Attorney, and keeping a list of medications and system for tracking them. It also discusses how to avoid risk for bone loss.  Biology of Weight Control  Clinical staff conducted group or individual video education with verbal and written material and guidebook.  Patient learns that weight gain occurs because we consume more calories than we burn (eating more, moving less). Even if your body weight is normal, you may have higher ratios of fat compared to muscle mass. Too much body fat puts you at increased risk for cardiovascular disease, heart attack, stroke, type 2 diabetes, and obesity-related cancers. In addition to exercise, following the Pritikin Eating Plan can help reduce your risk.  Decoding Lab Results  Clinical staff conducted group or individual video education with verbal and written material and guidebook.  Patient learns that lab test reflects one measurement whose values change over time and are influenced by many factors, including medication, stress, sleep, exercise, food, hydration, pre-existing medical conditions, and more. It is recommended to use the knowledge from this  video to become more involved with your lab results and evaluate your numbers to speak with your doctor.   Diseases of Our Time - Overview  Clinical staff conducted group or individual video education with verbal and written material and guidebook.  Patient learns that according to the CDC, 50% to 70% of chronic diseases (such as obesity, type 2 diabetes, elevated lipids, hypertension, and heart disease) are avoidable through lifestyle improvements including healthier food choices, listening to satiety cues, and increased physical activity.  Sleep Disorders Clinical staff conducted group or individual video education with verbal and written material and guidebook.  Patient learns how good quality and duration of sleep are important to overall health and well-being. Patient also learns about sleep disorders and how they impact health along with recommendations to address them, including discussing with a physician.  Nutrition  Dining Out - Part 2 Clinical staff conducted group or individual video education with verbal and written material and guidebook.  Patient learns how to plan ahead and communicate in order to maximize their dining experience in a healthy and nutritious manner. Included are recommended food choices based on the type of restaurant the patient is visiting.   Fueling a Banker conducted group or individual video education with verbal and written material and guidebook.  There is a strong connection between our food choices and our health. Diseases like obesity and type 2 diabetes are very prevalent and are in large-part due to lifestyle choices. The Pritikin Eating Plan provides plenty of food and hunger-curbing satisfaction. It is easy to follow, affordable, and helps reduce health risks.  Menu Workshop  Clinical staff conducted group or individual video education with verbal and written material and guidebook.  Patient learns that restaurant meals can  sabotage health goals  because they are often packed with calories, fat, sodium, and sugar. Recommendations include strategies to plan ahead and to communicate with the manager, chef, or server to help order a healthier meal.  Planning Your Eating Strategy  Clinical staff conducted group or individual video education with verbal and written material and guidebook.  Patient learns about the Pritikin Eating Plan and its benefit of reducing the risk of disease. The Pritikin Eating Plan does not focus on calories. Instead, it emphasizes high-quality, nutrient-rich foods. By knowing the characteristics of the foods, we choose, we can determine their calorie density and make informed decisions.  Targeting Your Nutrition Priorities  Clinical staff conducted group or individual video education with verbal and written material and guidebook.  Patient learns that lifestyle habits have a tremendous impact on disease risk and progression. This video provides eating and physical activity recommendations based on your personal health goals, such as reducing LDL cholesterol, losing weight, preventing or controlling type 2 diabetes, and reducing high blood pressure.  Vitamins and Minerals  Clinical staff conducted group or individual video education with verbal and written material and guidebook.  Patient learns different ways to obtain key vitamins and minerals, including through a recommended healthy diet. It is important to discuss all supplements you take with your doctor.   Healthy Mind-Set    Smoking Cessation  Clinical staff conducted group or individual video education with verbal and written material and guidebook.  Patient learns that cigarette smoking and tobacco addiction pose a serious health risk which affects millions of people. Stopping smoking will significantly reduce the risk of heart disease, lung disease, and many forms of cancer. Recommended strategies for quitting are covered, including  working with your doctor to develop a successful plan.  Culinary   Becoming a Set designer conducted group or individual video education with verbal and written material and guidebook.  Patient learns that cooking at home can be healthy, cost-effective, quick, and puts them in control. Keys to cooking healthy recipes will include looking at your recipe, assessing your equipment needs, planning ahead, making it simple, choosing cost-effective seasonal ingredients, and limiting the use of added fats, salts, and sugars.  Cooking - Breakfast and Snacks  Clinical staff conducted group or individual video education with verbal and written material and guidebook.  Patient learns how important breakfast is to satiety and nutrition through the entire day. Recommendations include key foods to eat during breakfast to help stabilize blood sugar levels and to prevent overeating at meals later in the day. Planning ahead is also a key component.  Cooking - Educational psychologist conducted group or individual video education with verbal and written material and guidebook.  Patient learns eating strategies to improve overall health, including an approach to cook more at home. Recommendations include thinking of animal protein as a side on your plate rather than center stage and focusing instead on lower calorie dense options like vegetables, fruits, whole grains, and plant-based proteins, such as beans. Making sauces in large quantities to freeze for later and leaving the skin on your vegetables are also recommended to maximize your experience.  Cooking - Healthy Salads and Dressing Clinical staff conducted group or individual video education with verbal and written material and guidebook.  Patient learns that vegetables, fruits, whole grains, and legumes are the foundations of the Pritikin Eating Plan. Recommendations include how to incorporate each of these in flavorful and healthy  salads, and how to create homemade salad dressings.  Proper handling of ingredients is also covered. Cooking - Soups and [employer] - Soups and Desserts Clinical staff conducted group or individual video education with verbal and written material and guidebook.  Patient learns that Pritikin soups and desserts make for easy, nutritious, and delicious snacks and meal components that are low in sodium, fat, sugar, and calorie density, while high in vitamins, minerals, and filling fiber. Recommendations include simple and healthy ideas for soups and desserts.   Overview     The Pritikin Solution Program Overview Clinical staff conducted group or individual video education with verbal and written material and guidebook.  Patient learns that the results of the Pritikin Program have been documented in more than 100 articles published in peer-reviewed journals, and the benefits include reducing risk factors for (and, in some cases, even reversing) high cholesterol, high blood pressure, type 2 diabetes, obesity, and more! An overview of the three key pillars of the Pritikin Program will be covered: eating well, doing regular exercise, and having a healthy mind-set.  WORKSHOPS  Exercise: Exercise Basics: Building Your Action Plan Clinical staff led group instruction and group discussion with PowerPoint presentation and patient guidebook. To enhance the learning environment the use of posters, models and videos may be added. At the conclusion of this workshop, patients will comprehend the difference between physical activity and exercise, as well as the benefits of incorporating both, into their routine. Patients will understand the FITT (Frequency, Intensity, Time, and Type) principle and how to use it to build an exercise action plan. In addition, safety concerns and other considerations for exercise and cardiac rehab will be addressed by the presenter. The purpose of this lesson is to promote a  comprehensive and effective weekly exercise routine in order to improve patients' overall level of fitness.   Managing Heart Disease: Your Path to a Healthier Heart Clinical staff led group instruction and group discussion with PowerPoint presentation and patient guidebook. To enhance the learning environment the use of posters, models and videos may be added.At the conclusion of this workshop, patients will understand the anatomy and physiology of the heart. Additionally, they will understand how Pritikin's three pillars impact the risk factors, the progression, and the management of heart disease.  The purpose of this lesson is to provide a high-level overview of the heart, heart disease, and how the Pritikin lifestyle positively impacts risk factors.  Exercise Biomechanics Clinical staff led group instruction and group discussion with PowerPoint presentation and patient guidebook. To enhance the learning environment the use of posters, models and videos may be added. Patients will learn how the structural parts of their bodies function and how these functions impact their daily activities, movement, and exercise. Patients will learn how to promote a neutral spine, learn how to manage pain, and identify ways to improve their physical movement in order to promote healthy living. The purpose of this lesson is to expose patients to common physical limitations that impact physical activity. Participants will learn practical ways to adapt and manage aches and pains, and to minimize their effect on regular exercise. Patients will learn how to maintain good posture while sitting, walking, and lifting.  Balance Training and Fall Prevention  Clinical staff led group instruction and group discussion with PowerPoint presentation and patient guidebook. To enhance the learning environment the use of posters, models and videos may be added. At the conclusion of this workshop, patients will understand the  importance of their sensorimotor skills (vision, proprioception, and the vestibular system) in  maintaining their ability to balance as they age. Patients will apply a variety of balancing exercises that are appropriate for their current level of function. Patients will understand the common causes for poor balance, possible solutions to these problems, and ways to modify their physical environment in order to minimize their fall risk. The purpose of this lesson is to teach patients about the importance of maintaining balance as they age and ways to minimize their risk of falling.  WORKSHOPS   Nutrition:  Fueling a Ship broker led group instruction and group discussion with PowerPoint presentation and patient guidebook. To enhance the learning environment the use of posters, models and videos may be added. Patients will review the foundational principles of the Pritikin Eating Plan and understand what constitutes a serving size in each of the food groups. Patients will also learn Pritikin-friendly foods that are better choices when away from home and review make-ahead meal and snack options. Calorie density will be reviewed and applied to three nutrition priorities: weight maintenance, weight loss, and weight gain. The purpose of this lesson is to reinforce (in a group setting) the key concepts around what patients are recommended to eat and how to apply these guidelines when away from home by planning and selecting Pritikin-friendly options. Patients will understand how calorie density may be adjusted for different weight management goals.  Mindful Eating  Clinical staff led group instruction and group discussion with PowerPoint presentation and patient guidebook. To enhance the learning environment the use of posters, models and videos may be added. Patients will briefly review the concepts of the Pritikin Eating Plan and the importance of low-calorie dense foods. The concept of mindful  eating will be introduced as well as the importance of paying attention to internal hunger signals. Triggers for non-hunger eating and techniques for dealing with triggers will be explored. The purpose of this lesson is to provide patients with the opportunity to review the basic principles of the Pritikin Eating Plan, discuss the value of eating mindfully and how to measure internal cues of hunger and fullness using the Hunger Scale. Patients will also discuss reasons for non-hunger eating and learn strategies to use for controlling emotional eating.  Targeting Your Nutrition Priorities Clinical staff led group instruction and group discussion with PowerPoint presentation and patient guidebook. To enhance the learning environment the use of posters, models and videos may be added. Patients will learn how to determine their genetic susceptibility to disease by reviewing their family history. Patients will gain insight into the importance of diet as part of an overall healthy lifestyle in mitigating the impact of genetics and other environmental insults. The purpose of this lesson is to provide patients with the opportunity to assess their personal nutrition priorities by looking at their family history, their own health history and current risk factors. Patients will also be able to discuss ways of prioritizing and modifying the Pritikin Eating Plan for their highest risk areas  Menu  Clinical staff led group instruction and group discussion with PowerPoint presentation and patient guidebook. To enhance the learning environment the use of posters, models and videos may be added. Using menus brought in from [employer], or printed from [employer], patients will apply the Pritikin dining out guidelines that were presented in the [employer] video. Patients will also be able to practice these guidelines in a variety of provided scenarios. The purpose of this lesson is to provide  patients with the opportunity to practice hands-on learning  of the [employer] guidelines with actual menus and practice scenarios.  Label Reading Clinical staff led group instruction and group discussion with PowerPoint presentation and patient guidebook. To enhance the learning environment the use of posters, models and videos may be added. Patients will review and discuss the Pritikin label reading guidelines presented in Pritikin's Label Reading Educational series video. Using fool labels brought in from local grocery stores and markets, patients will apply the label reading guidelines and determine if the packaged food meet the Pritikin guidelines. The purpose of this lesson is to provide patients with the opportunity to review, discuss, and practice hands-on learning of the Pritikin Label Reading guidelines with actual packaged food labels. Cooking School  Pritikin's [employer] are designed to teach patients ways to prepare quick, simple, and affordable recipes at home. The importance of nutrition's role in chronic disease risk reduction is reflected in its emphasis in the overall Pritikin program. By learning how to prepare essential core Pritikin Eating Plan recipes, patients will increase control over what they eat; be able to customize the flavor of foods without the use of added salt, sugar, or fat; and improve the quality of the food they consume. By learning a set of core recipes which are easily assembled, quickly prepared, and affordable, patients are more likely to prepare more healthy foods at home. These workshops focus on convenient breakfasts, simple entres, side dishes, and desserts which can be prepared with minimal effort and are consistent with nutrition recommendations for cardiovascular risk reduction. Cooking [employer] are taught by a Armed forces logistics/support/administrative officer (RD) who has been trained by the [employer]. The chef or RD has a clear  understanding of the importance of minimizing - if not completely eliminating - added fat, sugar, and sodium in recipes. Throughout the series of Cooking School Workshop sessions, patients will learn about healthy ingredients and efficient methods of cooking to build confidence in their capability to prepare    Cooking School weekly topics:  Adding Flavor- Sodium-Free  Fast and Healthy Breakfasts  Powerhouse Plant-Based Proteins  Satisfying Salads and Dressings  Simple Sides and Sauces  International Cuisine-Spotlight on the [employer] Zones  Delicious Desserts  Savory Soups  [employer] - Meals in a Astronomer Appetizers and Snacks  Comforting Weekend Breakfasts  One-Pot Wonders   Fast Evening Meals  Landscape architect Your Pritikin Plate  WORKSHOPS   Healthy Mindset (Psychosocial):  Focused Goals, Sustainable Changes Clinical staff led group instruction and group discussion with PowerPoint presentation and patient guidebook. To enhance the learning environment the use of posters, models and videos may be added. Patients will be able to apply effective goal setting strategies to establish at least one personal goal, and then take consistent, meaningful action toward that goal. They will learn to identify common barriers to achieving personal goals and develop strategies to overcome them. Patients will also gain an understanding of how our mind-set can impact our ability to achieve goals and the importance of cultivating a positive and growth-oriented mind-set. The purpose of this lesson is to provide patients with a deeper understanding of how to set and achieve personal goals, as well as the tools and strategies needed to overcome common obstacles which may arise along the way.  From Head to Heart: The Power of a Healthy Outlook  Clinical staff led group instruction and group discussion with PowerPoint presentation and patient guidebook. To enhance the learning  environment the use of posters,  models and videos may be added. Patients will be able to recognize and describe the impact of emotions and mood on physical health. They will discover the importance of self-care and explore self-care practices which may work for them. Patients will also learn how to utilize the 4 C's to cultivate a healthier outlook and better manage stress and challenges. The purpose of this lesson is to demonstrate to patients how a healthy outlook is an essential part of maintaining good health, especially as they continue their cardiac rehab journey.  Healthy Sleep for a Healthy Heart Clinical staff led group instruction and group discussion with PowerPoint presentation and patient guidebook. To enhance the learning environment the use of posters, models and videos may be added. At the conclusion of this workshop, patients will be able to demonstrate knowledge of the importance of sleep to overall health, well-being, and quality of life. They will understand the symptoms of, and treatments for, common sleep disorders. Patients will also be able to identify daytime and nighttime behaviors which impact sleep, and they will be able to apply these tools to help manage sleep-related challenges. The purpose of this lesson is to provide patients with a general overview of sleep and outline the importance of quality sleep. Patients will learn about a few of the most common sleep disorders. Patients will also be introduced to the concept of "sleep hygiene," and discover ways to self-manage certain sleeping problems through simple daily behavior changes. Finally, the workshop will motivate patients by clarifying the links between quality sleep and their goals of heart-healthy living.   Recognizing and Reducing Stress Clinical staff led group instruction and group discussion with PowerPoint presentation and patient guidebook. To enhance the learning environment the use of posters, models and videos  may be added. At the conclusion of this workshop, patients will be able to understand the types of stress reactions, differentiate between acute and chronic stress, and recognize the impact that chronic stress has on their health. They will also be able to apply different coping mechanisms, such as reframing negative self-talk. Patients will have the opportunity to practice a variety of stress management techniques, such as deep abdominal breathing, progressive muscle relaxation, and/or guided imagery.  The purpose of this lesson is to educate patients on the role of stress in their lives and to provide healthy techniques for coping with it.  Learning Barriers/Preferences:  Learning Barriers/Preferences - 07/10/24 1243       Learning Barriers/Preferences   Learning Barriers Sight   wears glasses   Learning Preferences Video;Pictoral          Education Topics:  Knowledge Questionnaire Score:  Knowledge Questionnaire Score - 07/10/24 1234       Knowledge Questionnaire Score   Pre Score 24/24          Core Components/Risk Factors/Patient Goals at Admission:  Personal Goals and Risk Factors at Admission - 07/10/24 1144       Core Components/Risk Factors/Patient Goals on Admission   Lipids Yes    Intervention Provide education and support for participant on nutrition & aerobic/resistive exercise along with prescribed medications to achieve LDL 70mg , HDL >40mg .    Expected Outcomes Short Term: Participant states understanding of desired cholesterol values and is compliant with medications prescribed. Participant is following exercise prescription and nutrition guidelines.;Long Term: Cholesterol controlled with medications as prescribed, with individualized exercise RX and with personalized nutrition plan. Value goals: LDL < 70mg , HDL > 40 mg.  Core Components/Risk Factors/Patient Goals Review:   Goals and Risk Factor Review     Row Name 07/31/24 1624              Core Components/Risk Factors/Patient Goals Review   Personal Goals Review Weight Management/Obesity;Lipids;Heart Failure       Review Waverly has been doing well with exercise. Natividad's vital signs have been stable. Maddisen has increased her met levels.       Expected Outcomes Tynlee will continue to participate in cardiac rehab for exercise, nutrition and lifestyle modificaitons.          Core Components/Risk Factors/Patient Goals at Discharge (Final Review):   Goals and Risk Factor Review - 07/31/24 1624       Core Components/Risk Factors/Patient Goals Review   Personal Goals Review Weight Management/Obesity;Lipids;Heart Failure    Review Kynslee has been doing well with exercise. Annitta's vital signs have been stable. Thy has increased her met levels.    Expected Outcomes Shana will continue to participate in cardiac rehab for exercise, nutrition and lifestyle modificaitons.          ITP Comments:  ITP Comments     Row Name 07/10/24 1030 07/31/24 1619         ITP Comments Medical Director- Dr. Wilbert Bihari, MD. [facility] Education Program/ Intensive Cardiac Rehab Program. Initial orientation folder reviewed with Robertta. 30 Day ITP Review. Ayeisha started cardiac rehab on 07/16/24. Journe is off to a good start to exercise.         Comments: See ITP comments.Hadassah Elpidio Quan RN BSN

## 2024-08-01 ENCOUNTER — Encounter (HOSPITAL_COMMUNITY)
Admission: RE | Admit: 2024-08-01 | Discharge: 2024-08-01 | Disposition: A | Discharge status: Home / Self Care | Source: Ambulatory Visit | Attending: Cardiology | Admitting: Cardiology

## 2024-08-01 DIAGNOSIS — Z955 Presence of coronary angioplasty implant and graft: Secondary | ICD-10-CM

## 2024-08-01 DIAGNOSIS — I213 ST elevation (STEMI) myocardial infarction of unspecified site: Secondary | ICD-10-CM | POA: Diagnosis not present

## 2024-08-01 DIAGNOSIS — I252 Old myocardial infarction: Secondary | ICD-10-CM | POA: Diagnosis not present

## 2024-08-01 DIAGNOSIS — Z48812 Encounter for surgical aftercare following surgery on the circulatory system: Secondary | ICD-10-CM | POA: Diagnosis not present

## 2024-08-03 ENCOUNTER — Encounter (HOSPITAL_COMMUNITY)
Admission: RE | Admit: 2024-08-03 | Discharge: 2024-08-03 | Disposition: A | Discharge status: Home / Self Care | Source: Ambulatory Visit | Attending: Cardiology | Admitting: Cardiology

## 2024-08-03 DIAGNOSIS — Z48812 Encounter for surgical aftercare following surgery on the circulatory system: Secondary | ICD-10-CM | POA: Diagnosis not present

## 2024-08-03 DIAGNOSIS — I213 ST elevation (STEMI) myocardial infarction of unspecified site: Secondary | ICD-10-CM

## 2024-08-03 DIAGNOSIS — Z955 Presence of coronary angioplasty implant and graft: Secondary | ICD-10-CM

## 2024-08-03 NOTE — Progress Notes (Signed)
 CARDIAC REHAB PHASE 2  Reviewed home exercise with pt today. Pt is tolerating exercise well. Pt will continue to exercise on her own by walking, stationary bike, light hand weights and exercise videos on youtube for 4 minutes per session 30-45 days a week in addition to the 3 days in CRP2. Advised pt on THRR, RPE scale, hydration and temperature/humidity precautions. Reinforced NTG use, S/S to stop exercise and when to call MD vs 911. Encouraged warm up cool down and stretches with exercise sessions. Pt verbalized understanding, all questions were answered and pt was given a copy to take home.    Alec GORMAN Finder ACSM-CEP 08/03/2024 5:01 PM

## 2024-08-06 ENCOUNTER — Encounter (HOSPITAL_COMMUNITY)
Admission: RE | Admit: 2024-08-06 | Discharge: 2024-08-06 | Disposition: A | Discharge status: Home / Self Care | Source: Ambulatory Visit | Attending: Cardiology

## 2024-08-06 DIAGNOSIS — I213 ST elevation (STEMI) myocardial infarction of unspecified site: Secondary | ICD-10-CM

## 2024-08-06 DIAGNOSIS — Z48812 Encounter for surgical aftercare following surgery on the circulatory system: Secondary | ICD-10-CM | POA: Diagnosis not present

## 2024-08-06 DIAGNOSIS — Z955 Presence of coronary angioplasty implant and graft: Secondary | ICD-10-CM

## 2024-08-08 ENCOUNTER — Encounter (HOSPITAL_COMMUNITY)
Admission: RE | Admit: 2024-08-08 | Discharge: 2024-08-08 | Disposition: A | Discharge status: Home / Self Care | Source: Ambulatory Visit | Attending: Cardiology

## 2024-08-08 DIAGNOSIS — Z48812 Encounter for surgical aftercare following surgery on the circulatory system: Secondary | ICD-10-CM | POA: Diagnosis not present

## 2024-08-08 DIAGNOSIS — I213 ST elevation (STEMI) myocardial infarction of unspecified site: Secondary | ICD-10-CM

## 2024-08-08 DIAGNOSIS — Z955 Presence of coronary angioplasty implant and graft: Secondary | ICD-10-CM

## 2024-08-10 ENCOUNTER — Encounter (HOSPITAL_COMMUNITY)
Admission: RE | Admit: 2024-08-10 | Discharge: 2024-08-10 | Disposition: A | Discharge status: Home / Self Care | Source: Ambulatory Visit | Attending: Cardiology | Admitting: Cardiology

## 2024-08-10 DIAGNOSIS — Z48812 Encounter for surgical aftercare following surgery on the circulatory system: Secondary | ICD-10-CM | POA: Diagnosis not present

## 2024-08-10 DIAGNOSIS — I252 Old myocardial infarction: Secondary | ICD-10-CM | POA: Diagnosis not present

## 2024-08-10 DIAGNOSIS — Z955 Presence of coronary angioplasty implant and graft: Secondary | ICD-10-CM | POA: Diagnosis not present

## 2024-08-10 DIAGNOSIS — I213 ST elevation (STEMI) myocardial infarction of unspecified site: Secondary | ICD-10-CM

## 2024-08-13 ENCOUNTER — Encounter (HOSPITAL_COMMUNITY)
Admission: RE | Admit: 2024-08-13 | Discharge: 2024-08-13 | Disposition: A | Discharge status: Home / Self Care | Source: Ambulatory Visit | Attending: Cardiology | Admitting: Cardiology

## 2024-08-13 DIAGNOSIS — I252 Old myocardial infarction: Secondary | ICD-10-CM | POA: Diagnosis not present

## 2024-08-13 DIAGNOSIS — Z48812 Encounter for surgical aftercare following surgery on the circulatory system: Secondary | ICD-10-CM | POA: Diagnosis not present

## 2024-08-13 DIAGNOSIS — I213 ST elevation (STEMI) myocardial infarction of unspecified site: Secondary | ICD-10-CM

## 2024-08-13 DIAGNOSIS — Z955 Presence of coronary angioplasty implant and graft: Secondary | ICD-10-CM | POA: Diagnosis not present

## 2024-08-15 ENCOUNTER — Encounter (HOSPITAL_COMMUNITY)
Admission: RE | Admit: 2024-08-15 | Discharge: 2024-08-15 | Disposition: A | Discharge status: Home / Self Care | Source: Ambulatory Visit | Attending: Cardiology | Admitting: Cardiology

## 2024-08-15 DIAGNOSIS — I213 ST elevation (STEMI) myocardial infarction of unspecified site: Secondary | ICD-10-CM

## 2024-08-15 DIAGNOSIS — Z48812 Encounter for surgical aftercare following surgery on the circulatory system: Secondary | ICD-10-CM | POA: Diagnosis not present

## 2024-08-15 DIAGNOSIS — Z955 Presence of coronary angioplasty implant and graft: Secondary | ICD-10-CM

## 2024-08-17 ENCOUNTER — Encounter (HOSPITAL_COMMUNITY)
Admission: RE | Admit: 2024-08-17 | Discharge: 2024-08-17 | Disposition: A | Discharge status: Home / Self Care | Source: Ambulatory Visit | Attending: Cardiology | Admitting: Cardiology

## 2024-08-17 DIAGNOSIS — I213 ST elevation (STEMI) myocardial infarction of unspecified site: Secondary | ICD-10-CM

## 2024-08-17 DIAGNOSIS — I252 Old myocardial infarction: Secondary | ICD-10-CM | POA: Diagnosis not present

## 2024-08-17 DIAGNOSIS — Z48812 Encounter for surgical aftercare following surgery on the circulatory system: Secondary | ICD-10-CM | POA: Diagnosis not present

## 2024-08-17 DIAGNOSIS — Z955 Presence of coronary angioplasty implant and graft: Secondary | ICD-10-CM

## 2024-08-20 ENCOUNTER — Encounter: Payer: Self-pay | Admitting: Cardiology

## 2024-08-20 ENCOUNTER — Encounter (HOSPITAL_COMMUNITY)
Admission: RE | Admit: 2024-08-20 | Discharge: 2024-08-20 | Disposition: A | Discharge status: Home / Self Care | Source: Ambulatory Visit | Attending: Cardiology | Admitting: Cardiology

## 2024-08-20 DIAGNOSIS — Z48812 Encounter for surgical aftercare following surgery on the circulatory system: Secondary | ICD-10-CM | POA: Diagnosis not present

## 2024-08-20 DIAGNOSIS — Z955 Presence of coronary angioplasty implant and graft: Secondary | ICD-10-CM

## 2024-08-20 DIAGNOSIS — I213 ST elevation (STEMI) myocardial infarction of unspecified site: Secondary | ICD-10-CM

## 2024-08-22 ENCOUNTER — Encounter (HOSPITAL_COMMUNITY)
Admission: RE | Admit: 2024-08-22 | Discharge: 2024-08-22 | Disposition: A | Discharge status: Home / Self Care | Source: Ambulatory Visit | Attending: Cardiology | Admitting: Cardiology

## 2024-08-22 DIAGNOSIS — Z955 Presence of coronary angioplasty implant and graft: Secondary | ICD-10-CM | POA: Insufficient documentation

## 2024-08-22 DIAGNOSIS — I213 ST elevation (STEMI) myocardial infarction of unspecified site: Secondary | ICD-10-CM | POA: Insufficient documentation

## 2024-08-24 ENCOUNTER — Encounter (HOSPITAL_COMMUNITY)
Admission: RE | Admit: 2024-08-24 | Discharge: 2024-08-24 | Disposition: A | Discharge status: Home / Self Care | Source: Ambulatory Visit | Attending: Cardiology | Admitting: Cardiology

## 2024-08-24 DIAGNOSIS — Z955 Presence of coronary angioplasty implant and graft: Secondary | ICD-10-CM

## 2024-08-24 DIAGNOSIS — I213 ST elevation (STEMI) myocardial infarction of unspecified site: Secondary | ICD-10-CM

## 2024-08-27 ENCOUNTER — Encounter (HOSPITAL_COMMUNITY)
Admission: RE | Admit: 2024-08-27 | Discharge: 2024-08-27 | Disposition: A | Discharge status: Home / Self Care | Source: Ambulatory Visit | Attending: Cardiology | Admitting: Cardiology

## 2024-08-27 DIAGNOSIS — I213 ST elevation (STEMI) myocardial infarction of unspecified site: Secondary | ICD-10-CM | POA: Diagnosis not present

## 2024-08-27 DIAGNOSIS — Z955 Presence of coronary angioplasty implant and graft: Secondary | ICD-10-CM

## 2024-08-29 ENCOUNTER — Encounter (HOSPITAL_COMMUNITY)
Admission: RE | Admit: 2024-08-29 | Discharge: 2024-08-29 | Disposition: A | Source: Ambulatory Visit | Attending: Cardiology

## 2024-08-29 ENCOUNTER — Ambulatory Visit (HOSPITAL_COMMUNITY): Payer: Self-pay | Admitting: Cardiology

## 2024-08-29 ENCOUNTER — Ambulatory Visit (HOSPITAL_COMMUNITY)
Admission: RE | Admit: 2024-08-29 | Discharge: 2024-08-29 | Disposition: A | Discharge status: Home / Self Care | Source: Ambulatory Visit | Attending: Cardiology | Admitting: Cardiology

## 2024-08-29 ENCOUNTER — Ambulatory Visit (HOSPITAL_BASED_OUTPATIENT_CLINIC_OR_DEPARTMENT_OTHER)
Admission: RE | Admit: 2024-08-29 | Discharge: 2024-08-29 | Disposition: A | Discharge status: Home / Self Care | Source: Ambulatory Visit | Attending: Cardiology | Admitting: Cardiology

## 2024-08-29 VITALS — BP 106/64 | HR 72 | Wt 140.0 lb

## 2024-08-29 DIAGNOSIS — I2510 Atherosclerotic heart disease of native coronary artery without angina pectoris: Secondary | ICD-10-CM | POA: Insufficient documentation

## 2024-08-29 DIAGNOSIS — Z955 Presence of coronary angioplasty implant and graft: Secondary | ICD-10-CM

## 2024-08-29 DIAGNOSIS — I5022 Chronic systolic (congestive) heart failure: Secondary | ICD-10-CM | POA: Insufficient documentation

## 2024-08-29 DIAGNOSIS — I071 Rheumatic tricuspid insufficiency: Secondary | ICD-10-CM | POA: Diagnosis not present

## 2024-08-29 DIAGNOSIS — Z79899 Other long term (current) drug therapy: Secondary | ICD-10-CM | POA: Insufficient documentation

## 2024-08-29 DIAGNOSIS — E7800 Pure hypercholesterolemia, unspecified: Secondary | ICD-10-CM | POA: Insufficient documentation

## 2024-08-29 DIAGNOSIS — Z7902 Long term (current) use of antithrombotics/antiplatelets: Secondary | ICD-10-CM | POA: Insufficient documentation

## 2024-08-29 DIAGNOSIS — I252 Old myocardial infarction: Secondary | ICD-10-CM | POA: Diagnosis not present

## 2024-08-29 DIAGNOSIS — I472 Ventricular tachycardia, unspecified: Secondary | ICD-10-CM | POA: Diagnosis not present

## 2024-08-29 DIAGNOSIS — I213 ST elevation (STEMI) myocardial infarction of unspecified site: Secondary | ICD-10-CM

## 2024-08-29 DIAGNOSIS — R7989 Other specified abnormal findings of blood chemistry: Secondary | ICD-10-CM | POA: Diagnosis not present

## 2024-08-29 DIAGNOSIS — I255 Ischemic cardiomyopathy: Secondary | ICD-10-CM | POA: Insufficient documentation

## 2024-08-29 DIAGNOSIS — Z7984 Long term (current) use of oral hypoglycemic drugs: Secondary | ICD-10-CM | POA: Insufficient documentation

## 2024-08-29 DIAGNOSIS — I5020 Unspecified systolic (congestive) heart failure: Secondary | ICD-10-CM

## 2024-08-29 DIAGNOSIS — Z7982 Long term (current) use of aspirin: Secondary | ICD-10-CM | POA: Diagnosis not present

## 2024-08-29 LAB — BASIC METABOLIC PANEL WITH GFR
Anion gap: 10 (ref 5–15)
BUN: 16 mg/dL (ref 6–20)
CO2: 25 mmol/L (ref 22–32)
Calcium: 9.6 mg/dL (ref 8.9–10.3)
Chloride: 100 mmol/L (ref 98–111)
Creatinine, Ser: 0.85 mg/dL (ref 0.44–1.00)
GFR, Estimated: >60 mL/min (ref >60)
Glucose, Bld: 109 mg/dL — ABNORMAL HIGH (ref 70–99)
Potassium: 4.9 mmol/L (ref 3.5–5.1)
Sodium: 135 mmol/L (ref 135–145)

## 2024-08-29 LAB — BRAIN NATRIURETIC PEPTIDE: B Natriuretic Peptide: 136.8 pg/mL — ABNORMAL HIGH (ref 0.0–100.0)

## 2024-08-29 MED ORDER — SACUBITRIL-VALSARTAN 49-51 MG PO TABS: 1.0000 | ORAL_TABLET | Freq: Two times a day (BID) | ORAL | 11 refills | Status: AC

## 2024-08-29 MED ORDER — PERFLUTREN LIPID MICROSPHERE
1.0000 mL | INTRAVENOUS | Status: DC | PRN
  Administered 2024-08-29: 2 mL via INTRAVENOUS

## 2024-08-29 NOTE — Progress Notes (Signed)
 PCP: Rolinda Millman, MD  [facility] Cardiology: Dr. Rolan  Chief Complaint: CHF  HPI: Maria Zamora is a 63 y.o. female with history of no significant medical history other than hypercholesterolemia. Patient presented 06/05/24 with anterolateral STEMI. LHC with 70-80% stenosis in tiny OM1, 40-50% OM2, occluded proximal LAD successfully treated with DES. Echo in 7/25 showed EF around 30% (on my review). GDMT titrated but limited by soft BP. Had runs of NSVT post reperfusion and was fitted for LifeVest.   Echo was done today and reviewed, EF 50-55% on my read with mid-apical anterior mild hypokinesis, normal RV, trivial MR.   She returns for followup of CHF.  She has been wearing her Lifevest except when showering.  She is doing cardiac rehab.  Weight down 7 lbs.  No significant exertional dyspnea, no chest pain.  No orthopnea/PND.  No palpitations.  Occasional lightheadedness if she stands too fast but this has been manageable.     ECG (personally reviewed): NSR, old anteroseptal MI, LAFB.   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 Hyperlipidemia 2. CAD: Anterior STEMI in 7/25.  Cath with 70-80% stenosis in tiny OM1, 40-50% OM2, occluded proximal LAD successfully treated with DES. 3. Chronic systolic CHF: Ischemic cardiomyopathy.  Echo (7/25) with EF 30% by my read.  - Echo (10/25): EF 50-55% on my read with mid-apical anterior mild hypokinesis, normal RV, trivial MR.   SH: She is a retired Manufacturing systems engineer. No ETOH, tobacco or drug use.  Married, 1 daughter .  FH: Has strong family history of CAD on father's side (father and aunts with MIs at young ages).   ROS: All systems reviewed and negative except as per HPI.    Current Outpatient Medications  Medication Sig Dispense Refill   aspirin  EC 81 MG tablet Take 1 tablet (81 mg total) by mouth daily. Swallow whole. 120 tablet 2   atorvastatin  (LIPITOR ) 80 MG tablet  Take 1 tablet (80 mg total) by mouth daily. 30 tablet 11   cetirizine (ZYRTEC) 10 MG tablet Take 10 mg by mouth daily as needed for allergies (seasonal).     dapagliflozin  propanediol (FARXIGA ) 10 MG TABS tablet Take 1 tablet (10 mg total) by mouth daily. 30 tablet 11   fexofenadine (ALLEGRA) 180 MG tablet Take 180 mg by mouth daily as needed for allergies or rhinitis (Seasonal).     metoprolol  succinate (TOPROL -XL) 50 MG 24 hr tablet Take 1 tablet (50 mg total) by mouth at bedtime. 30 tablet 3   Multiple Vitamin (MULTIVITAMIN WITH MINERALS) TABS tablet Take 1 tablet by mouth daily.     nitroGLYCERIN  (NITROSTAT ) 0.4 MG SL tablet Place 1 tablet (0.4 mg total) under the tongue every 5 (five) minutes as needed for chest pain. 25 tablet 3   spironolactone  (ALDACTONE ) 25 MG tablet Take 1 tablet (25 mg total) by mouth at bedtime. 30 tablet 3   ticagrelor  (BRILINTA ) 90 MG TABS tablet Take 1 tablet (90 mg total) by mouth 2 (two) times daily. 60 tablet 11   sacubitril -valsartan  (ENTRESTO ) 49-51 MG Take 1 tablet by mouth 2 (two) times daily. 60 tablet 11   No current facility-administered medications for this encounter.    No Known Allergies    Social History   Socioeconomic History   Marital status: Married    Spouse name: Marcey   Number of children: 1   Years of education:  Not on file   Highest education level: Bachelor's degree (e.g., BA, AB, BS)  Occupational History   Occupation: Retired  Tobacco Use   Smoking status: Never   Smokeless tobacco: Never  Vaping Use   Vaping status: Never Used  Substance and Sexual Activity   Alcohol use: Not Currently   Drug use: No   Sexual activity: Yes    Partners: Male    Birth control/protection: Post-menopausal  Other Topics Concern   Not on file  Social History Narrative   Not on file   Social Drivers of Health   Financial Resource Strain: Low Risk  (06/07/2024)   Overall Financial Resource Strain (CARDIA)    Difficulty of Paying Living  Expenses: Not very hard  Food Insecurity: No Food Insecurity (06/06/2024)   Hunger Vital Sign    Worried About Running Out of Food in the Last Year: Never true    Ran Out of Food in the Last Year: Never true  Transportation Needs: No Transportation Needs (06/06/2024)   PRAPARE - Administrator, Civil Service (Medical): No    Lack of Transportation (Non-Medical): No  Physical Activity: Not on file  Stress: Not on file  Social Connections: Unknown (06/06/2024)   Social Connection and Isolation Panel    Frequency of Communication with Friends and Family: Not on file    Frequency of Social Gatherings with Friends and Family: Not on file    Attends Religious Services: Not on file    Active Member of Clubs or Organizations: Not on file    Attends Banker Meetings: Not on file    Marital Status: Married  Intimate Partner Violence: Not At Risk (06/06/2024)   Humiliation, Afraid, Rape, and Kick questionnaire    Fear of Current or Ex-Partner: No    Emotionally Abused: No    Physically Abused: No    Sexually Abused: No      Family History  Problem Relation Age of Onset   Heart attack Father    Hypertension Father    Hypertension Brother    Cancer Maternal Uncle        prostate   Cancer Maternal Grandfather        prostate   Heart attack Paternal Grandmother    Heart disease Paternal Grandmother    Heart attack Paternal Grandfather    Heart disease Paternal Grandfather     Vitals:   08/29/24 0948  BP: 106/64  Pulse: 72  SpO2: 98%  Weight: 63.5 kg (140 lb)    PHYSICAL EXAM: General: NAD Neck: No JVD, no thyromegaly or thyroid nodule.  Lungs: Clear to auscultation bilaterally with normal respiratory effort. CV: Nondisplaced PMI.  Heart regular S1/S2, no S3/S4, no murmur.  No peripheral edema.  No carotid bruit.  Normal pedal pulses.  Abdomen: Soft, nontender, no hepatosplenomegaly, no distention.  Skin: Intact without lesions or rashes.  Neurologic:  Alert and oriented x 3.  Psych: Normal affect. Extremities: No clubbing or cyanosis.  HEENT: Normal.   ASSESSMENT & PLAN: 1.  Chronic systolic CHF: Ischemic cardiomyopathy.  Echo in 7/25 with LVEF read as 40-45% (EF at most 30-35% on my review), WMA in LAD territory.  She is wearing a Lifevest.  I reviewed today's echo, EF 50-55% with mid to apical anterior mild hypokinesis.  NYHA class I, she is not volume overloaded on exam though BNP has been elevated.  - With improved EF, she can remove Lifevest. EF is out of ICD range.  -  Continue Entresto  49/51 bid.  - Continue spironolactone  25 mg at bedtime. BMET/BNP today.  - Continue Toprol  XL 25 mg at bedtime.  - Continue Farxiga  10 mg daily 2. CAD: Anterior STEMI in 7/25. LHC with 70-80% tiny OM1, 40-50% OM2, occluded p LAD s/p PTCA/DES.  No chest pain.  - Continue ASA + ticagrelor  X 1 year, then would transition to single antiplatelet agent with Plavix.  - On high-intensity statin, good LDL in 8/25.  - Continue cardiac rehab.  3. HLD: LDL < 55 in 8/25, continue atorvastatin  80 mg daily.  4. NSVT: Noted post reperfusion during 7/25 hospitalization.  - With improvement in EF, she will remove Lifevest.    Followup 4 months.   I spent 31 minutes reviewing records, interviewing/examining patient, and managing orders.   Ezra Shuck 08/29/2024

## 2024-08-29 NOTE — Progress Notes (Signed)
 Cardiac Individual Treatment Plan  Patient Details  Name: Maria Zamora MRN: [id number] Date of Birth: Jun 03, 1961 Referring Provider:   Flowsheet Row INTENSIVE CARDIAC REHAB ORIENT from 07/10/2024 in [facility], [facility]  Referring Provider Ladona Heinz, MD    Initial Encounter Date:  Flowsheet Row INTENSIVE CARDIAC REHAB ORIENT from 07/10/2024 in [facility] for Heart, Vascular, & Lung Health  Date 07/10/24    Visit Diagnosis: 06/05/24 STEMI  06/05/24 DES LAD, D1, PTA  Patient's Home Medications on Admission:  Current Outpatient Medications:    aspirin  EC 81 MG tablet, Take 1 tablet (81 mg total) by mouth daily. Swallow whole., Disp: 120 tablet, Rfl: 2   atorvastatin  (LIPITOR ) 80 MG tablet, Take 1 tablet (80 mg total) by mouth daily., Disp: 30 tablet, Rfl: 11   cetirizine (ZYRTEC) 10 MG tablet, Take 10 mg by mouth daily as needed for allergies (seasonal)., Disp: , Rfl:    dapagliflozin  propanediol (FARXIGA ) 10 MG TABS tablet, Take 1 tablet (10 mg total) by mouth daily., Disp: 30 tablet, Rfl: 11   fexofenadine (ALLEGRA) 180 MG tablet, Take 180 mg by mouth daily as needed for allergies or rhinitis (Seasonal)., Disp: , Rfl:    metoprolol  succinate (TOPROL -XL) 50 MG 24 hr tablet, Take 1 tablet (50 mg total) by mouth at bedtime., Disp: 30 tablet, Rfl: 3   Multiple Vitamin (MULTIVITAMIN WITH MINERALS) TABS tablet, Take 1 tablet by mouth daily., Disp: , Rfl:    nitroGLYCERIN  (NITROSTAT ) 0.4 MG SL tablet, Place 1 tablet (0.4 mg total) under the tongue every 5 (five) minutes as needed for chest pain., Disp: 25 tablet, Rfl: 3   sacubitril -valsartan  (ENTRESTO ) 49-51 MG, Take 1 tablet by mouth 2 (two) times daily., Disp: 60 tablet, Rfl: 11   spironolactone  (ALDACTONE ) 25 MG tablet, Take 1 tablet (25 mg total) by mouth at bedtime., Disp: 30 tablet, Rfl: 3   ticagrelor  (BRILINTA ) 90 MG TABS tablet, Take 1 tablet (90 mg total)  by mouth 2 (two) times daily., Disp: 60 tablet, Rfl: 11 No current facility-administered medications for this encounter.  Facility-Administered Medications Ordered in Other Encounters:    perflutren  lipid microspheres (DEFINITY ) IV suspension, 1-10 mL, Intravenous, PRN, Rolan Ezra RAMAN, MD, 2 mL at 08/29/24 0913  Past Medical History: Past Medical History:  Diagnosis Date   Coronary artery disease    Hyperlipidemia    MI (myocardial infarction) (HCC)     Tobacco Use: Social History   Tobacco Use  Smoking Status Never  Smokeless Tobacco Never    Labs: Review Flowsheet  More data exists      Latest Ref Rng & Units 06/13/2015 06/16/2016 06/05/2024 06/17/2024 07/03/2024  Labs for ITP Cardiac and Pulmonary Rehab  Cholestrol 100 - 199 mg/dL 794  789  774  - 878   LDL (calc) 0 - 99 mg/dL 71  889  866  - 50   HDL-C >39 mg/dL 61  73  47  - 44   Trlycerides 0 - 149 mg/dL 635  866  774  - 840   Hemoglobin A1c 4.8 - 5.6 % - - 5.2  - -  TCO2 22 - 32 mmol/L - - - 26  -     Exercise Target Goals: Exercise Program Goal: Individual exercise prescription set using results from initial 6 min walk test and THRR while considering  patient's activity barriers and safety.   Exercise Prescription Goal: Initial exercise prescription builds to 30-45  minutes a day of aerobic activity, 2-3 days per week.  Home exercise guidelines will be given to patient during program as part of exercise prescription that the participant will acknowledge.   Education: Aerobic Exercise: - Group verbal and visual presentation on the components of exercise prescription. Introduces F.I.T.T principle from ACSM for exercise prescriptions.  Reviews F.I.T.T. principles of aerobic exercise including progression. Written material provided at class time.   Education: Resistance Exercise: - Group verbal and visual presentation on the components of exercise prescription. Introduces F.I.T.T principle from ACSM for exercise  prescriptions  Reviews F.I.T.T. principles of resistance exercise including progression. Written material provided at class time.    Education: Exercise & Equipment Safety: - Individual verbal instruction and demonstration of equipment use and safety with use of the equipment.   Education: Exercise Physiology & General Exercise Guidelines: - Group verbal and written instruction with models to review the exercise physiology of the cardiovascular system and associated critical values. Provides general exercise guidelines with specific guidelines to those with heart or lung disease. Written material provided at class time.   Education: Flexibility, Balance, Mind/Body Relaxation: - Group verbal and visual presentation with interactive activity on the components of exercise prescription. Introduces F.I.T.T principle from ACSM for exercise prescriptions. Reviews F.I.T.T. principles of flexibility and balance exercise training including progression. Also discusses the mind body connection.  Reviews various relaxation techniques to help reduce and manage stress (i.e. Deep breathing, progressive muscle relaxation, and visualization). Balance handout provided to take home. Written material provided at class time.   Activity Barriers & Risk Stratification:  Activity Barriers & Cardiac Risk Stratification - 07/10/24 1144       Activity Barriers & Cardiac Risk Stratification   Activity Barriers Other (comment)    Comments Low ejection fraction    Cardiac Risk Stratification High          6 Minute Walk:  6 Minute Walk     Row Name 07/10/24 1154         6 Minute Walk   Phase Initial     Distance 1566 feet     Walk Time 6 minutes     # of Rest Breaks 0     MPH 2.96     METS 3.82     RPE 9     Perceived Dyspnea  0     VO2 Peak 13.37     Symptoms No     Resting HR 77 bpm     Resting BP 102/64     Resting Oxygen Saturation  98 %     Exercise Oxygen Saturation  during 6 min walk 98 %      Max Ex. HR 101 bpm     Max Ex. BP 100/60     2 Minute Post BP 102/60        Oxygen Initial Assessment:   Oxygen Re-Evaluation:   Oxygen Discharge (Final Oxygen Re-Evaluation):   Initial Exercise Prescription:  Initial Exercise Prescription - 07/10/24 1200       Date of Initial Exercise RX and Referring Provider   Date 07/10/24    Referring Provider Ladona Heinz, MD    Expected Discharge Date 10/03/24      Bike   Level 1    Minutes 15    METs 2.5      NuStep   Level 1    SPM 85    Minutes 15    METs 2.5      Prescription Details  Frequency (times per week) 3    Duration Progress to 30 minutes of continuous aerobic without signs/symptoms of physical distress      Intensity   THRR 40-80% of Max Heartrate 64-128    Ratings of Perceived Exertion 11-13    Perceived Dyspnea 0-4      Progression   Progression Continue to progress workloads to maintain intensity without signs/symptoms of physical distress.      Resistance Training   Training Prescription Yes    Weight 2 lbs    Reps 10-15          Perform Capillary Blood Glucose checks as needed.  Exercise Prescription Changes:   Exercise Prescription Changes     Row Name 07/16/24 1638 08/03/24 1654 08/13/24 1630         Response to Exercise   Blood Pressure (Admit) 110/60 100/60 92/52     Blood Pressure (Exercise) 128/78 108/70 --     Blood Pressure (Exit) 100/60 100/62 98/64     Heart Rate (Admit) 67 bpm 70 bpm 64 bpm     Heart Rate (Exercise) 101 bpm 123 bpm 132 bpm     Heart Rate (Exit) 84 bpm 83 bpm 76 bpm     Rating of Perceived Exertion (Exercise) 10 10 10      Perceived Dyspnea (Exercise) 0 0 0     Symptoms none none none     Comments Patient first day in the [facility] program Reveiwed MET's, goals and home ExRx REVD MEt     Duration Progress to 30 minutes of  aerobic without signs/symptoms of physical distress Progress to 30 minutes of  aerobic without signs/symptoms of physical distress  Progress to 30 minutes of  aerobic without signs/symptoms of physical distress     Intensity THRR unchanged THRR unchanged THRR unchanged       Progression   Progression Continue to progress workloads to maintain intensity without signs/symptoms of physical distress. Continue to progress workloads to maintain intensity without signs/symptoms of physical distress. Continue to progress workloads to maintain intensity without signs/symptoms of physical distress.     Average METs 2.3 3.45 4.1       Resistance Training   Training Prescription Yes Yes Yes     Weight 2 lbs 3 lbs wts 3 lbs wts     Reps 10-15 10-15 10-15     Time 10 Minutes 10 Minutes 10 Minutes       Bike   Level 1 2.5 3.5     Watts 18 42 55     Minutes 15 15 15      METs 2.8 4.1 4.4       NuStep   Level 1 2  MET's taken from last session, did not get them today 3  MET's taken from last session, did not get them today     SPM 62 107 117     Minutes 15 15 15      METs 1.8 2.8 3.8        Exercise Comments:   Exercise Comments     Row Name 07/16/24 1645 08/03/24 1701 08/13/24 1630       Exercise Comments Pt first day in the [facility] program. Pt tolerated exercise well with an average MET level of 2.3. Pt is off to a good start and is learning her THRR, RPE and ExRx Reviewed MET's, goals and home ExRx. Pt tolerated exercise well with an average MET level of 3.45. Pt is doing well and progressing  MET's. She is feeling good about her goals and things at home are getting easier. She knows her limits to exercise and we reviewed exercise at home today. She will exercise 4 days on her own by walking, stationary bike, light handweights, exercise videos 30-45 mins per session Reviewed MET's, goals and home ExRx. Pt tolerated exercise well with an average MET level of 4.1. Pt is doing well and progressing MET's. She is eager to have her next echo, some mild symptoms with low BP, but overall she feels well and is increasing strength         Exercise Goals and Review:   Exercise Goals     Row Name 07/10/24 1144             Exercise Goals   Increase Physical Activity Yes       Intervention Provide advice, education, support and counseling about physical activity/exercise needs.;Develop an individualized exercise prescription for aerobic and resistive training based on initial evaluation findings, risk stratification, comorbidities and participant's personal goals.       Expected Outcomes Short Term: Attend rehab on a regular basis to increase amount of physical activity.;Long Term: Exercising regularly at least 3-5 days a week.;Long Term: Add in home exercise to make exercise part of routine and to increase amount of physical activity.       Increase Strength and Stamina Yes       Intervention Provide advice, education, support and counseling about physical activity/exercise needs.;Develop an individualized exercise prescription for aerobic and resistive training based on initial evaluation findings, risk stratification, comorbidities and participant's personal goals.       Expected Outcomes Short Term: Increase workloads from initial exercise prescription for resistance, speed, and METs.;Short Term: Perform resistance training exercises routinely during rehab and add in resistance training at home;Long Term: Improve cardiorespiratory fitness, muscular endurance and strength as measured by increased METs and functional capacity ( )       Able to understand and use rate of perceived exertion (RPE) scale Yes       Intervention Provide education and explanation on how to use RPE scale       Expected Outcomes Short Term: Able to use RPE daily in rehab to express subjective intensity level;Long Term:  Able to use RPE to guide intensity level when exercising independently       Knowledge and understanding of Target Heart Rate Range (THRR) Yes       Intervention Provide education and explanation of THRR including how the numbers  were predicted and where they are located for reference       Expected Outcomes Short Term: Able to state/look up THRR;Long Term: Able to use THRR to govern intensity when exercising independently;Short Term: Able to use daily as guideline for intensity in rehab       Able to check pulse independently Yes       Intervention Provide education and demonstration on how to check pulse in carotid and radial arteries.;Review the importance of being able to check your own pulse for safety during independent exercise       Expected Outcomes Short Term: Able to explain why pulse checking is important during independent exercise;Long Term: Able to check pulse independently and accurately       Understanding of Exercise Prescription Yes       Intervention Provide education, explanation, and written materials on patient's individual exercise prescription       Expected Outcomes Short Term: Able to explain program exercise prescription;Long  Term: Able to explain home exercise prescription to exercise independently          Exercise Goals Re-Evaluation :  Exercise Goals Re-Evaluation     Row Name 07/16/24 1642 08/03/24 1658           Exercise Goal Re-Evaluation   Exercise Goals Review Increase Physical Activity;Understanding of Exercise Prescription;Increase Strength and Stamina;Knowledge and understanding of Target Heart Rate Range (THRR);Able to understand and use rate of perceived exertion (RPE) scale Increase Physical Activity;Understanding of Exercise Prescription;Increase Strength and Stamina;Knowledge and understanding of Target Heart Rate Range (THRR);Able to understand and use rate of perceived exertion (RPE) scale      Comments Pt first day in the [facility] program. Pt tolerated exercise well with an average MET level of 2.3. Pt is off to a good start and is learning her THRR, RPE and ExRx Reviewed MET's, goals and home ExRx. Pt tolerated exercise well with an average MET level of 3.45. Pt is  doing well and progressing MET's. She is feeling good about her goals and things at home are getting easier. She knows her limits to exercise and we reviewed exercise at home today. She will exercise 4 days on her own by walking, stationary bike, light handweights, exercise videos 30-45 mins per session      Expected Outcomes Will continue to monitor pt and progress workloads as tolerated without sign or symptom Will continue to monitor pt and progress workloads as tolerated without sign or symptom         Discharge Exercise Prescription (Final Exercise Prescription Changes):  Exercise Prescription Changes - 08/13/24 1630       Response to Exercise   Blood Pressure (Admit) 92/52    Blood Pressure (Exit) 98/64    Heart Rate (Admit) 64 bpm    Heart Rate (Exercise) 132 bpm    Heart Rate (Exit) 76 bpm    Rating of Perceived Exertion (Exercise) 10    Perceived Dyspnea (Exercise) 0    Symptoms none    Comments REVD MEt    Duration Progress to 30 minutes of  aerobic without signs/symptoms of physical distress    Intensity THRR unchanged      Progression   Progression Continue to progress workloads to maintain intensity without signs/symptoms of physical distress.    Average METs 4.1      Resistance Training   Training Prescription Yes    Weight 3 lbs wts    Reps 10-15    Time 10 Minutes      Bike   Level 3.5    Watts 55    Minutes 15    METs 4.4      NuStep   Level 3   MET's taken from last session, did not get them today   SPM 117    Minutes 15    METs 3.8          Nutrition:  Target Goals: Understanding of nutrition guidelines, daily intake of sodium 1500mg , cholesterol 200mg , calories 30% from fat and 7% or less from saturated fats, daily to have 5 or more servings of fruits and vegetables.  Education: Nutrition 1 -Group instruction provided by verbal, written material, interactive activities, discussions, models, and posters to present general guidelines for heart  healthy nutrition including macronutrients, label reading, and promoting whole foods over processed counterparts. Education serves as Pensions consultant of discussion of heart healthy eating for all. Written material provided at class time.    Education: Nutrition 2 -Group instruction  provided by verbal, written material, interactive activities, discussions, models, and posters to present general guidelines for heart healthy nutrition including sodium, cholesterol, and saturated fat. Providing guidance of habit forming to improve blood pressure, cholesterol, and body weight. Written material provided at class time.     Biometrics:  Pre Biometrics - 07/10/24 1030       Pre Biometrics   Waist Circumference 32 inches    Hip Circumference 41.75 inches    Waist to Hip Ratio 0.77 %    Triceps Skinfold 31 mm    % Body Fat 36 %    Grip Strength 16 kg    Flexibility 14.88 in    Single Leg Stand 28.18 seconds           Nutrition Therapy Plan and Nutrition Goals:   Nutrition Assessments:  Nutrition Assessments - 07/18/24 1649       Rate Your Plate Scores   Pre Score 70         MEDIFICTS Score Key: >=70 Need to make dietary changes  40-70 Heart Healthy Diet <= 40 Therapeutic Level Cholesterol Diet  Flowsheet Row INTENSIVE CARDIAC REHAB from 07/18/2024 in [facility] for Heart, Vascular, & Lung Health  Picture Your Plate Total Score on Admission 70   Picture Your Plate Scores: <59 Unhealthy dietary pattern with much room for improvement. 41-50 Dietary pattern unlikely to meet recommendations for good health and room for improvement. 51-60 More healthful dietary pattern, with some room for improvement.  >60 Healthy dietary pattern, although there may be some specific behaviors that could be improved.    Nutrition Goals Re-Evaluation:   Nutrition Goals Discharge (Final Nutrition Goals Re-Evaluation):   Psychosocial: Target Goals: Acknowledge presence or  absence of significant depression and/or stress, maximize coping skills, provide positive support system. Participant is able to verbalize types and ability to use techniques and skills needed for reducing stress and depression.   Education: Stress, Anxiety, and Depression - Group verbal and visual presentation to define topics covered.  Reviews how body is impacted by stress, anxiety, and depression.  Also discusses healthy ways to reduce stress and to treat/manage anxiety and depression. Written material provided at class time.   Education: Sleep Hygiene -Provides group verbal and written instruction about how sleep can affect your health.  Define sleep hygiene, discuss sleep cycles and impact of sleep habits. Review good sleep hygiene tips.   Initial Review & Psychosocial Screening:  Initial Psych Review & Screening - 07/10/24 1235       Initial Review   Current issues with Current Stress Concerns    Source of Stress Concerns Chronic Illness;Unable to perform yard/household activities    Comments Maven repots that she has frequent urination now that she is taking a diuretic. Discussed sleep hygiene with the patient and her husband      Family Dynamics   Good Support System? Yes   Dayana has her husband, daughter, mother and firends for support. Mavi has a good support network     Barriers   Psychosocial barriers to participate in program The patient should benefit from training in stress management and relaxation.      Screening Interventions   Interventions Encouraged to exercise;To provide support and resources with identified psychosocial needs;Provide feedback about the scores to participant    Expected Outcomes Long Term Goal: Stressors or current issues are controlled or eliminated.;Short Term goal: Identification and review with participant of any Quality of Life or Depression concerns found by  scoring the questionnaire.;Long Term goal: The participant improves quality of Life  and PHQ9 Scores as seen by post scores and/or verbalization of changes          Quality of Life Scores:   Quality of Life - 07/10/24 1234       Quality of Life   Select Quality of Life      Quality of Life Scores   Health/Function Pre 25.2 %    Socioeconomic Pre 28.21 %    Psych/Spiritual Pre 26.57 %    Family Pre 30 %    GLOBAL Pre 26.81 %         Scores of 19 and below usually indicate a poorer quality of life in these areas.  A difference of  2-3 points is a clinically meaningful difference.  A difference of 2-3 points in the total score of the Quality of Life Index has been associated with significant improvement in overall quality of life, self-image, physical symptoms, and general health in studies assessing change in quality of life.  PHQ-9: Review Flowsheet       07/10/2024 03/15/2024  Depression screen PHQ 2/9  Decreased Interest 0 0  Down, Depressed, Hopeless 0 0  PHQ - 2 Score 0 0  Altered sleeping 1 -  Tired, decreased energy 0 -  Change in appetite 0 -  Feeling bad or failure about yourself  0 -  Trouble concentrating 1 -  Moving slowly or fidgety/restless 0 -  Suicidal thoughts 0 -  PHQ-9 Score 2 -  Difficult doing work/chores Not difficult at all -   Interpretation of Total Score  Total Score Depression Severity:  1-4 = Minimal depression, 5-9 = Mild depression, 10-14 = Moderate depression, 15-19 = Moderately severe depression, 20-27 = Severe depression   Psychosocial Evaluation and Intervention:   Psychosocial Re-Evaluation:  Psychosocial Re-Evaluation     Row Name 07/31/24 1621 08/29/24 1053           Psychosocial Re-Evaluation   Current issues with Current Stress Concerns Current Stress Concerns      Comments Nayleen did discuss that her life vest was going off and she has had it resized as she has lost a lot of weight Tyauna has not voiced any additional pyschosocial concerns or stressors during exercise at cardiac rehab.      Expected  Outcomes Rayna will have contolled or decreased stressors upon completion of cardiac rehab. Kathe will have contolled or decreased stressors upon completion of cardiac rehab.      Interventions Stress management education;Encouraged to attend Cardiac Rehabilitation for the exercise;Relaxation education Stress management education;Encouraged to attend Cardiac Rehabilitation for the exercise;Relaxation education      Continue Psychosocial Services  Follow up required by staff Follow up required by staff        Initial Review   Source of Stress Concerns Chronic Illness --      Comments Will continue to monitor and offer support as needed --         Psychosocial Discharge (Final Psychosocial Re-Evaluation):  Psychosocial Re-Evaluation - 08/29/24 1053       Psychosocial Re-Evaluation   Current issues with Current Stress Concerns    Comments Makia has not voiced any additional pyschosocial concerns or stressors during exercise at cardiac rehab.    Expected Outcomes Alysen will have contolled or decreased stressors upon completion of cardiac rehab.    Interventions Stress management education;Encouraged to attend Cardiac Rehabilitation for the exercise;Relaxation education    Continue Psychosocial  Services  Follow up required by staff          Vocational Rehabilitation: Provide vocational rehab assistance to qualifying candidates.   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 Participant will state understanding/return demonstration of topics presented as noted by education test  scores.  Learning Barriers/Preferences:  Learning Barriers/Preferences - 07/10/24 1243       Learning Barriers/Preferences   Learning Barriers Sight   wears glasses   Learning Preferences Video;Pictoral          General Cardiac Education Topics:  AED/CPR: - Group verbal and written instruction with the use of models to demonstrate the basic use of the AED with the basic ABC's of resuscitation.   Test and Procedures: - Group verbal and visual presentation and models provide information about basic cardiac anatomy and function. Reviews the testing methods done to diagnose heart disease and the outcomes of the test results. Describes the treatment choices: Medical Management, Angioplasty, or Coronary Bypass Surgery for treating various heart conditions including Myocardial Infarction, Angina, Valve Disease, and Cardiac Arrhythmias. Written material provided at class time.   Medication Safety: - Group verbal and visual instruction to review commonly prescribed medications for heart and lung disease. Reviews the medication, class of the drug, and side effects. Includes the steps to properly store meds and maintain the prescription regimen. Written material provided at class time.   Intimacy: - Group verbal instruction through game format to discuss how heart and lung disease can affect sexual intimacy. Written material provided at class time.   Know Your Numbers and Heart Failure: - Group verbal and visual instruction to discuss disease risk factors for cardiac and pulmonary disease and treatment options.  Reviews associated critical values for Overweight/Obesity, Hypertension, Cholesterol, and Diabetes.  Discusses basics of heart failure: signs/symptoms and treatments.  Introduces Heart Failure Zone chart for action plan for heart failure. Written material provided at class time.   Infection Prevention: - Provides verbal and written material to individual with discussion of infection  control including proper hand washing and proper equipment cleaning during exercise session.   Falls Prevention: - Provides verbal and written material to individual with discussion of falls prevention and safety.   Other: -Provides group and verbal instruction on various topics (see comments)   Knowledge Questionnaire Score:  Knowledge Questionnaire Score - 07/10/24 1234       Knowledge Questionnaire Score   Pre Score 24/24          Core Components/Risk Factors/Patient Goals at Admission:  Personal Goals and Risk Factors at Admission - 07/10/24 1144       Core Components/Risk Factors/Patient Goals on Admission   Lipids Yes    Intervention Provide education and support for participant on nutrition & aerobic/resistive exercise along with prescribed medications to achieve LDL 70mg , HDL >40mg .    Expected Outcomes Short Term: Participant states understanding of desired cholesterol values and is compliant with medications prescribed. Participant is following exercise prescription and nutrition guidelines.;Long Term: Cholesterol controlled with medications as prescribed, with individualized exercise RX and with personalized nutrition plan.  Value goals: LDL < 70mg , HDL > 40 mg.          Education:Diabetes - Individual verbal and written instruction to review signs/symptoms of diabetes, desired ranges of glucose level fasting, after meals and with exercise. Acknowledge that pre and post exercise glucose checks will be done for 3 sessions at entry of program.   Core Components/Risk Factors/Patient Goals Review:   Goals and Risk Factor Review     Row Name 07/31/24 1624 08/29/24 1054           Core Components/Risk Factors/Patient Goals Review   Personal Goals Review Weight Management/Obesity;Lipids;Heart Failure Weight Management/Obesity;Lipids;Heart Failure      Review Virga has been doing well with exercise. Karinna's vital signs have been stable. Shemia has increased her met  levels. Roselie has been doing well with exercise. VSS. Nikhita has increased her met levels in the last 30 days      Expected Outcomes Xandrea will continue to participate in cardiac rehab for exercise, nutrition and lifestyle modificaitons. Cherryl will continue to participate in cardiac rehab for exercise, nutrition and lifestyle modificaitons.         Core Components/Risk Factors/Patient Goals at Discharge (Final Review):   Goals and Risk Factor Review - 08/29/24 1054       Core Components/Risk Factors/Patient Goals Review   Personal Goals Review Weight Management/Obesity;Lipids;Heart Failure    Review Syan has been doing well with exercise. VSS. Shaquana has increased her met levels in the last 30 days    Expected Outcomes Nahia will continue to participate in cardiac rehab for exercise, nutrition and lifestyle modificaitons.          ITP Comments:  ITP Comments     Row Name 07/10/24 1030 07/31/24 1619 08/29/24 1052       ITP Comments Medical Director- Dr. Wilbert Bihari, MD. [facility] Education Program/ Intensive Cardiac Rehab Program. Initial orientation folder reviewed with Fabiana. 30 Day ITP Review. Verbie started cardiac rehab on 07/16/24. Naria is off to a good start to exercise. 30 Day ITP Review. Shakeita has good attendance and participation with exercise at cardiac rehab        Comments: see ITP comments

## 2024-08-29 NOTE — Patient Instructions (Addendum)
 There has been no changes to your medications.  Labs done today, your results will be available in MyChart, we will contact you for abnormal readings.  You can stop wearing your life vest.   Your physician recommends that you schedule a follow-up appointment in: 4 months (February 2026) ** PLEASE CALL THE OFFICE IN DECEMBER TO ARRANGE YOUR FOLLOW UP APPOINTMENT.**  If you have any questions or concerns before your next appointment please send us  a message through [place] or call our office at [phone].    TO LEAVE A MESSAGE FOR THE NURSE SELECT OPTION 2, PLEASE LEAVE A MESSAGE INCLUDING: YOUR NAME DATE OF BIRTH CALL BACK NUMBER REASON FOR CALL**this is important as we prioritize the call backs  YOU WILL RECEIVE A CALL BACK THE SAME DAY AS LONG AS YOU CALL BEFORE 4:00 PM  At [facility], you and your health needs are our priority. As part of our continuing mission to provide you with exceptional heart care, we have created designated Provider Care Teams. These Care Teams include your primary Cardiologist (physician) and Advanced Practice Providers (APPs- Physician Assistants and Nurse Practitioners) who all work together to provide you with the care you need, when you need it.   You may see any of the following providers on your designated Care Team at your next follow up: Dr Toribio Fuel Dr Ezra Shuck Dr. Ria Commander Dr. Morene Brownie Amy Lenetta, NP Caffie Shed, [place] [facility] [place], [place] Beckey Coe, NP [place] Lee, NP Ellouise Class, NP Tinnie Redman, PharmD Jaun Bash, PharmD   Please be sure to bring in all your medications bottles to every appointment.    Thank you for choosing [facility]

## 2024-08-30 LAB — ECHOCARDIOGRAM COMPLETE
AR max vel: 2.68 cm2
AV Area VTI: 2.36 cm2
AV Area mean vel: 2.25 cm2
AV Mean grad: 4 mmHg
AV Peak grad: 5.2 mmHg
Ao pk vel: 1.14 m/s
Area-P 1/2: 5.06 cm2
Calc EF: 60.2 %
S' Lateral: 2.7 cm
Single Plane A2C EF: 56.2 %
Single Plane A4C EF: 63.2 %

## 2024-08-31 ENCOUNTER — Encounter (HOSPITAL_COMMUNITY)
Admission: RE | Admit: 2024-08-31 | Discharge: 2024-08-31 | Disposition: A | Discharge status: Home / Self Care | Source: Ambulatory Visit | Attending: Cardiology | Admitting: Cardiology

## 2024-08-31 DIAGNOSIS — I213 ST elevation (STEMI) myocardial infarction of unspecified site: Secondary | ICD-10-CM

## 2024-08-31 DIAGNOSIS — Z955 Presence of coronary angioplasty implant and graft: Secondary | ICD-10-CM

## 2024-09-03 ENCOUNTER — Encounter (HOSPITAL_COMMUNITY)
Admission: RE | Admit: 2024-09-03 | Discharge: 2024-09-03 | Disposition: A | Discharge status: Home / Self Care | Source: Ambulatory Visit | Attending: Cardiology | Admitting: Cardiology

## 2024-09-03 DIAGNOSIS — I213 ST elevation (STEMI) myocardial infarction of unspecified site: Secondary | ICD-10-CM

## 2024-09-03 DIAGNOSIS — Z955 Presence of coronary angioplasty implant and graft: Secondary | ICD-10-CM

## 2024-09-05 ENCOUNTER — Encounter (HOSPITAL_COMMUNITY)
Admission: RE | Admit: 2024-09-05 | Discharge: 2024-09-05 | Disposition: A | Discharge status: Home / Self Care | Source: Ambulatory Visit | Attending: Cardiology | Admitting: Cardiology

## 2024-09-05 DIAGNOSIS — I213 ST elevation (STEMI) myocardial infarction of unspecified site: Secondary | ICD-10-CM

## 2024-09-05 DIAGNOSIS — Z955 Presence of coronary angioplasty implant and graft: Secondary | ICD-10-CM

## 2024-09-07 ENCOUNTER — Encounter (HOSPITAL_COMMUNITY)
Admission: RE | Admit: 2024-09-07 | Discharge: 2024-09-07 | Disposition: A | Source: Ambulatory Visit | Attending: Cardiology

## 2024-09-07 DIAGNOSIS — I213 ST elevation (STEMI) myocardial infarction of unspecified site: Secondary | ICD-10-CM

## 2024-09-07 DIAGNOSIS — Z955 Presence of coronary angioplasty implant and graft: Secondary | ICD-10-CM

## 2024-09-10 ENCOUNTER — Other Ambulatory Visit: Payer: Self-pay | Admitting: Family Medicine

## 2024-09-10 ENCOUNTER — Encounter (HOSPITAL_COMMUNITY)
Admission: RE | Admit: 2024-09-10 | Discharge: 2024-09-10 | Disposition: A | Discharge status: Home / Self Care | Source: Ambulatory Visit | Attending: Cardiology | Admitting: Cardiology

## 2024-09-10 DIAGNOSIS — I213 ST elevation (STEMI) myocardial infarction of unspecified site: Secondary | ICD-10-CM | POA: Diagnosis not present

## 2024-09-10 DIAGNOSIS — Z1231 Encounter for screening mammogram for malignant neoplasm of breast: Secondary | ICD-10-CM

## 2024-09-10 DIAGNOSIS — Z955 Presence of coronary angioplasty implant and graft: Secondary | ICD-10-CM

## 2024-09-12 ENCOUNTER — Encounter (HOSPITAL_COMMUNITY)
Admission: RE | Admit: 2024-09-12 | Discharge: 2024-09-12 | Disposition: A | Discharge status: Home / Self Care | Source: Ambulatory Visit | Attending: Cardiology | Admitting: Cardiology

## 2024-09-12 ENCOUNTER — Other Ambulatory Visit: Payer: Self-pay | Admitting: Cardiology

## 2024-09-12 DIAGNOSIS — I213 ST elevation (STEMI) myocardial infarction of unspecified site: Secondary | ICD-10-CM | POA: Diagnosis not present

## 2024-09-12 DIAGNOSIS — I5022 Chronic systolic (congestive) heart failure: Secondary | ICD-10-CM

## 2024-09-12 DIAGNOSIS — Z955 Presence of coronary angioplasty implant and graft: Secondary | ICD-10-CM

## 2024-09-14 ENCOUNTER — Encounter (HOSPITAL_COMMUNITY)
Admission: RE | Admit: 2024-09-14 | Discharge: 2024-09-14 | Disposition: A | Discharge status: Home / Self Care | Source: Ambulatory Visit | Attending: Cardiology | Admitting: Cardiology

## 2024-09-14 DIAGNOSIS — Z955 Presence of coronary angioplasty implant and graft: Secondary | ICD-10-CM

## 2024-09-14 DIAGNOSIS — I213 ST elevation (STEMI) myocardial infarction of unspecified site: Secondary | ICD-10-CM

## 2024-09-17 ENCOUNTER — Encounter (HOSPITAL_COMMUNITY)
Admission: RE | Admit: 2024-09-17 | Discharge: 2024-09-17 | Disposition: A | Discharge status: Home / Self Care | Source: Ambulatory Visit | Attending: Cardiology | Admitting: Cardiology

## 2024-09-17 DIAGNOSIS — Z955 Presence of coronary angioplasty implant and graft: Secondary | ICD-10-CM

## 2024-09-17 DIAGNOSIS — I213 ST elevation (STEMI) myocardial infarction of unspecified site: Secondary | ICD-10-CM | POA: Diagnosis not present

## 2024-09-19 ENCOUNTER — Encounter (HOSPITAL_COMMUNITY)
Admission: RE | Admit: 2024-09-19 | Discharge: 2024-09-19 | Disposition: A | Discharge status: Home / Self Care | Source: Ambulatory Visit | Attending: Cardiology | Admitting: Cardiology

## 2024-09-19 DIAGNOSIS — I213 ST elevation (STEMI) myocardial infarction of unspecified site: Secondary | ICD-10-CM | POA: Diagnosis not present

## 2024-09-19 DIAGNOSIS — Z955 Presence of coronary angioplasty implant and graft: Secondary | ICD-10-CM

## 2024-09-21 ENCOUNTER — Encounter (HOSPITAL_COMMUNITY)
Admission: RE | Admit: 2024-09-21 | Discharge: 2024-09-21 | Disposition: A | Discharge status: Home / Self Care | Source: Ambulatory Visit | Attending: Cardiology | Admitting: Cardiology

## 2024-09-21 DIAGNOSIS — I213 ST elevation (STEMI) myocardial infarction of unspecified site: Secondary | ICD-10-CM | POA: Diagnosis not present

## 2024-09-21 DIAGNOSIS — Z955 Presence of coronary angioplasty implant and graft: Secondary | ICD-10-CM | POA: Diagnosis not present

## 2024-09-24 ENCOUNTER — Encounter (HOSPITAL_COMMUNITY)
Admission: RE | Admit: 2024-09-24 | Discharge: 2024-09-24 | Disposition: A | Discharge status: Home / Self Care | Source: Ambulatory Visit | Attending: Cardiology | Admitting: Cardiology

## 2024-09-24 DIAGNOSIS — Z955 Presence of coronary angioplasty implant and graft: Secondary | ICD-10-CM | POA: Insufficient documentation

## 2024-09-24 DIAGNOSIS — I252 Old myocardial infarction: Secondary | ICD-10-CM | POA: Diagnosis not present

## 2024-09-24 DIAGNOSIS — Z48812 Encounter for surgical aftercare following surgery on the circulatory system: Secondary | ICD-10-CM | POA: Diagnosis not present

## 2024-09-24 DIAGNOSIS — I213 ST elevation (STEMI) myocardial infarction of unspecified site: Secondary | ICD-10-CM

## 2024-09-25 ENCOUNTER — Ambulatory Visit
Admission: RE | Admit: 2024-09-25 | Discharge: 2024-09-25 | Disposition: A | Source: Ambulatory Visit | Attending: Family Medicine

## 2024-09-25 DIAGNOSIS — Z1231 Encounter for screening mammogram for malignant neoplasm of breast: Secondary | ICD-10-CM | POA: Diagnosis not present

## 2024-09-25 NOTE — Progress Notes (Signed)
 Cardiac Individual Treatment Plan  Patient Details  Name: Maria Zamora MRN: [id number] Date of Birth: Mar 05, 1961 Referring Provider:   Flowsheet Row INTENSIVE CARDIAC REHAB ORIENT from 07/10/2024 in [facility]  Referring Provider Ladona Heinz, MD    Initial Encounter Date:  Flowsheet Row INTENSIVE CARDIAC REHAB ORIENT from 07/10/2024 in [facility]  Date 07/10/24    Visit Diagnosis: 06/05/24 STEMI  06/05/24 DES LAD, D1, PTA  Patient's Home Medications on Admission:  Current Outpatient Medications:    aspirin  EC 81 MG tablet, Take 1 tablet (81 mg total) by mouth daily. Swallow whole., Disp: 120 tablet, Rfl: 2   atorvastatin  (LIPITOR ) 80 MG tablet, Take 1 tablet (80 mg total) by mouth daily., Disp: 30 tablet, Rfl: 11   cetirizine (ZYRTEC) 10 MG tablet, Take 10 mg by mouth daily as needed for allergies (seasonal)., Disp: , Rfl:    dapagliflozin  propanediol (FARXIGA ) 10 MG TABS tablet, Take 1 tablet (10 mg total) by mouth daily., Disp: 30 tablet, Rfl: 11   fexofenadine (ALLEGRA) 180 MG tablet, Take 180 mg by mouth daily as needed for allergies or rhinitis (Seasonal)., Disp: , Rfl:    metoprolol  succinate (TOPROL -XL) 50 MG 24 hr tablet, Take 1 tablet (50 mg total) by mouth at bedtime., Disp: 30 tablet, Rfl: 3   Multiple Vitamin (MULTIVITAMIN WITH MINERALS) TABS tablet, Take 1 tablet by mouth daily., Disp: , Rfl:    nitroGLYCERIN  (NITROSTAT ) 0.4 MG SL tablet, Place 1 tablet (0.4 mg total) under the tongue every 5 (five) minutes as needed for chest pain., Disp: 25 tablet, Rfl: 3   sacubitril -valsartan  (ENTRESTO ) 49-51 MG, Take 1 tablet by mouth 2 (two) times daily., Disp: 60 tablet, Rfl: 11   spironolactone  (ALDACTONE ) 25 MG tablet, Take 1 tablet (25 mg total) by mouth at bedtime., Disp: 30 tablet, Rfl: 3   ticagrelor  (BRILINTA ) 90 MG TABS tablet, Take 1 tablet (90 mg total)  by mouth 2 (two) times daily., Disp: 60 tablet, Rfl: 11  Past Medical History: Past Medical History:  Diagnosis Date   Coronary artery disease    Hyperlipidemia    MI (myocardial infarction) (HCC)     Tobacco Use: Social History   Tobacco Use  Smoking Status Never  Smokeless Tobacco Never    Labs: Review Flowsheet  More data exists      Latest Ref Rng & Units 06/13/2015 06/16/2016 06/05/2024 06/17/2024 07/03/2024  Labs for ITP Cardiac and Pulmonary Rehab  Cholestrol 100 - 199 mg/dL 794  789  774  - 878   LDL (calc) 0 - 99 mg/dL 71  889  866  - 50   HDL-C >39 mg/dL 61  73  47  - 44   Trlycerides 0 - 149 mg/dL 635  866  774  - 840   Hemoglobin A1c 4.8 - 5.6 % - - 5.2  - -  TCO2 22 - 32 mmol/L - - - 26  -    Capillary Blood Glucose: No results found for: GLUCAP   Exercise Target Goals: Exercise Program Goal: Individual exercise prescription set using results from initial 6 min walk test and THRR while considering  patient's activity barriers and safety.   Exercise Prescription Goal: Initial exercise prescription builds to 30-45 minutes a day of aerobic activity, 2-3 days per week.  Home exercise guidelines will be given to patient during program as part of exercise prescription that  the participant will acknowledge.  Activity Barriers & Risk Stratification:  Activity Barriers & Cardiac Risk Stratification - 07/10/24 1144       Activity Barriers & Cardiac Risk Stratification   Activity Barriers Other (comment)    Comments Low ejection fraction    Cardiac Risk Stratification High          6 Minute Walk:  6 Minute Walk     Row Name 07/10/24 1154         6 Minute Walk   Phase Initial     Distance 1566 feet     Walk Time 6 minutes     # of Rest Breaks 0     MPH 2.96     METS 3.82     RPE 9     Perceived Dyspnea  0     VO2 Peak 13.37     Symptoms No     Resting HR 77 bpm     Resting BP 102/64     Resting Oxygen Saturation  98 %     Exercise Oxygen  Saturation  during 6 min walk 98 %     Max Ex. HR 101 bpm     Max Ex. BP 100/60     2 Minute Post BP 102/60        Oxygen Initial Assessment:   Oxygen Re-Evaluation:   Oxygen Discharge (Final Oxygen Re-Evaluation):   Initial Exercise Prescription:  Initial Exercise Prescription - 07/10/24 1200       Date of Initial Exercise RX and Referring Provider   Date 07/10/24    Referring Provider Ladona Heinz, MD    Expected Discharge Date 10/03/24      Bike   Level 1    Minutes 15    METs 2.5      NuStep   Level 1    SPM 85    Minutes 15    METs 2.5      Prescription Details   Frequency (times per week) 3    Duration Progress to 30 minutes of continuous aerobic without signs/symptoms of physical distress      Intensity   THRR 40-80% of Max Heartrate 64-128    Ratings of Perceived Exertion 11-13    Perceived Dyspnea 0-4      Progression   Progression Continue to progress workloads to maintain intensity without signs/symptoms of physical distress.      Resistance Training   Training Prescription Yes    Weight 2 lbs    Reps 10-15          Perform Capillary Blood Glucose checks as needed.  Exercise Prescription Changes:   Exercise Prescription Changes     Row Name 07/16/24 1638 08/03/24 1654 08/13/24 1630 08/27/24 1630 09/21/24 1657     Response to Exercise   Blood Pressure (Admit) 110/60 100/60 92/52 94/60  98/50   Blood Pressure (Exercise) 128/78 108/70 -- -- --   Blood Pressure (Exit) 100/60 100/62 98/64 98/62  90/58   Heart Rate (Admit) 67 bpm 70 bpm 64 bpm 101 bpm 65 bpm   Heart Rate (Exercise) 101 bpm 123 bpm 132 bpm 127 bpm 124 bpm   Heart Rate (Exit) 84 bpm 83 bpm 76 bpm 90 bpm 76 bpm   Rating of Perceived Exertion (Exercise) 10 10 10 9 9    Perceived Dyspnea (Exercise) 0 0 0 0 0   Symptoms none none none none none   Comments Patient first day in the [employer] Reveiwed MET's, goals  and home ExRx REVD MEt REVD MET REVD MET and goals    Duration Progress to 30 minutes of  aerobic without signs/symptoms of physical distress Progress to 30 minutes of  aerobic without signs/symptoms of physical distress Progress to 30 minutes of  aerobic without signs/symptoms of physical distress Progress to 30 minutes of  aerobic without signs/symptoms of physical distress Progress to 30 minutes of  aerobic without signs/symptoms of physical distress   Intensity THRR unchanged THRR unchanged THRR unchanged THRR unchanged THRR unchanged     Progression   Progression Continue to progress workloads to maintain intensity without signs/symptoms of physical distress. Continue to progress workloads to maintain intensity without signs/symptoms of physical distress. Continue to progress workloads to maintain intensity without signs/symptoms of physical distress. Continue to progress workloads to maintain intensity without signs/symptoms of physical distress. Continue to progress workloads to maintain intensity without signs/symptoms of physical distress.   Average METs 2.3 3.45 4.1 4.4 4.9     Resistance Training   Training Prescription Yes Yes Yes Yes Yes   Weight 2 lbs 3 lbs wts 3 lbs wts 3 lbs wts 3 lbs wts   Reps 10-15 10-15 10-15 10-15 10-15   Time 10 Minutes 10 Minutes 10 Minutes 10 Minutes 10 Minutes     Bike   Level 1 2.5 3.5 3.5 4.5   Watts 18 42 55 53 67   Minutes 15 15 15 15 15    METs 2.8 4.1 4.4 4.7 5.2     NuStep   Level 1 2  MET's taken from last session, did not get them today 3  MET's taken from last session, did not get them today 3  MET's taken from last session, did not get them today 5   SPM 62 107 117 -- 107   Minutes 15 15 15 15 15    METs 1.8 2.8 3.8 4.1 4.6      Exercise Comments:   Exercise Comments     Row Name 07/16/24 1645 08/03/24 1701 08/13/24 1630 08/27/24 1630 09/21/24 1701   Exercise Comments Pt first day in the [facility] program. Pt tolerated exercise well with an average MET level of 2.3. Pt is off to a  good start and is learning her THRR, RPE and ExRx Reviewed MET's, goals and home ExRx. Pt tolerated exercise well with an average MET level of 3.45. Pt is doing well and progressing MET's. She is feeling good about her goals and things at home are getting easier. She knows her limits to exercise and we reviewed exercise at home today. She will exercise 4 days on her own by walking, stationary bike, light handweights, exercise videos 30-45 mins per session Reviewed MET's, goals and home ExRx. Pt tolerated exercise well with an average MET level of 4.1. Pt is doing well and progressing MET's. She is eager to have her next echo, some mild symptoms with low BP, but overall she feels well and is increasing strength Reviewed MET's. Pt tolerated exercise well with an average MET level of 4.4. She is doing very well and is progressing WL's and MET's Reviewed MET's and goals. Pt tolerated exercise well with an average MET level of 4.9. She is doing very well and is progressing WL's and MET's. Now that her lifevest is off she feels more free. She is able to do more and feel comfortable. She feels her exercise is light and would likee to increase more, but she is starting to run into her THRR. WIll  reach out to Dr. Ladona about a HR increase      Exercise Goals and Review:   Exercise Goals     Row Name 07/10/24 1144             Exercise Goals   Increase Physical Activity Yes       Intervention Provide advice, education, support and counseling about physical activity/exercise needs.;Develop an individualized exercise prescription for aerobic and resistive training based on initial evaluation findings, risk stratification, comorbidities and participant's personal goals.       Expected Outcomes Short Term: Attend rehab on a regular basis to increase amount of physical activity.;Long Term: Exercising regularly at least 3-5 days a week.;Long Term: Add in home exercise to make exercise part of routine and to increase  amount of physical activity.       Increase Strength and Stamina Yes       Intervention Provide advice, education, support and counseling about physical activity/exercise needs.;Develop an individualized exercise prescription for aerobic and resistive training based on initial evaluation findings, risk stratification, comorbidities and participant's personal goals.       Expected Outcomes Short Term: Increase workloads from initial exercise prescription for resistance, speed, and METs.;Short Term: Perform resistance training exercises routinely during rehab and add in resistance training at home;Long Term: Improve cardiorespiratory fitness, muscular endurance and strength as measured by increased METs and functional capacity ( )       Able to understand and use rate of perceived exertion (RPE) scale Yes       Intervention Provide education and explanation on how to use RPE scale       Expected Outcomes Short Term: Able to use RPE daily in rehab to express subjective intensity level;Long Term:  Able to use RPE to guide intensity level when exercising independently       Knowledge and understanding of Target Heart Rate Range (THRR) Yes       Intervention Provide education and explanation of THRR including how the numbers were predicted and where they are located for reference       Expected Outcomes Short Term: Able to state/look up THRR;Long Term: Able to use THRR to govern intensity when exercising independently;Short Term: Able to use daily as guideline for intensity in rehab       Able to check pulse independently Yes       Intervention Provide education and demonstration on how to check pulse in carotid and radial arteries.;Review the importance of being able to check your own pulse for safety during independent exercise       Expected Outcomes Short Term: Able to explain why pulse checking is important during independent exercise;Long Term: Able to check pulse independently and accurately        Understanding of Exercise Prescription Yes       Intervention Provide education, explanation, and written materials on patient's individual exercise prescription       Expected Outcomes Short Term: Able to explain program exercise prescription;Long Term: Able to explain home exercise prescription to exercise independently          Exercise Goals Re-Evaluation :  Exercise Goals Re-Evaluation     Row Name 07/16/24 1642 08/03/24 1658 08/27/24 1630 09/21/24 1659       Exercise Goal Re-Evaluation   Exercise Goals Review Increase Physical Activity;Understanding of Exercise Prescription;Increase Strength and Stamina;Knowledge and understanding of Target Heart Rate Range (THRR);Able to understand and use rate of perceived exertion (RPE) scale Increase Physical Activity;Understanding of Exercise Prescription;Increase  Strength and Stamina;Knowledge and understanding of Target Heart Rate Range (THRR);Able to understand and use rate of perceived exertion (RPE) scale Increase Physical Activity;Understanding of Exercise Prescription;Increase Strength and Stamina;Knowledge and understanding of Target Heart Rate Range (THRR);Able to understand and use rate of perceived exertion (RPE) scale Increase Physical Activity;Understanding of Exercise Prescription;Increase Strength and Stamina;Knowledge and understanding of Target Heart Rate Range (THRR);Able to understand and use rate of perceived exertion (RPE) scale    Comments Pt first day in the [facility] program. Pt tolerated exercise well with an average MET level of 2.3. Pt is off to a good start and is learning her THRR, RPE and ExRx Reviewed MET's, goals and home ExRx. Pt tolerated exercise well with an average MET level of 3.45. Pt is doing well and progressing MET's. She is feeling good about her goals and things at home are getting easier. She knows her limits to exercise and we reviewed exercise at home today. She will exercise 4 days on her own by walking,  stationary bike, light handweights, exercise videos 30-45 mins per session Reviewed MET's. Pt tolerated exercise well with an average MET level of 4.4. She is doing very well and is progressing WL's and MET's Reviewed MET's and goals. Pt tolerated exercise well with an average MET level of 4.9. She is doing very well and is progressing WL's and MET's. Now that her lifevest is off she feels more free. She is able to do more and feel comfortable. She feels her exercise is light and would likee to increase more, but she is starting to run into her THRR. WIll reach out to Dr. Ladona about a HR increase    Expected Outcomes Will continue to monitor pt and progress workloads as tolerated without sign or symptom Will continue to monitor pt and progress workloads as tolerated without sign or symptom Will continue to monitor pt and progress workloads as tolerated without sign or symptom Will continue to monitor pt and progress workloads as tolerated without sign or symptom       Discharge Exercise Prescription (Final Exercise Prescription Changes):  Exercise Prescription Changes - 09/21/24 1657       Response to Exercise   Blood Pressure (Admit) 98/50    Blood Pressure (Exit) 90/58    Heart Rate (Admit) 65 bpm    Heart Rate (Exercise) 124 bpm    Heart Rate (Exit) 76 bpm    Rating of Perceived Exertion (Exercise) 9    Perceived Dyspnea (Exercise) 0    Symptoms none    Comments REVD MET and goals    Duration Progress to 30 minutes of  aerobic without signs/symptoms of physical distress    Intensity THRR unchanged      Progression   Progression Continue to progress workloads to maintain intensity without signs/symptoms of physical distress.    Average METs 4.9      Resistance Training   Training Prescription Yes    Weight 3 lbs wts    Reps 10-15    Time 10 Minutes      Bike   Level 4.5    Watts 67    Minutes 15    METs 5.2      NuStep   Level 5    SPM 107    Minutes 15    METs 4.6           Nutrition:  Target Goals: Understanding of nutrition guidelines, daily intake of sodium 1500mg , cholesterol 200mg , calories 30% from fat and  7% or less from saturated fats, daily to have 5 or more servings of fruits and vegetables.  Biometrics:  Pre Biometrics - 07/10/24 1030       Pre Biometrics   Waist Circumference 32 inches    Hip Circumference 41.75 inches    Waist to Hip Ratio 0.77 %    Triceps Skinfold 31 mm    % Body Fat 36 %    Grip Strength 16 kg    Flexibility 14.88 in    Single Leg Stand 28.18 seconds           Nutrition Therapy Plan and Nutrition Goals:   Nutrition Assessments:  Nutrition Assessments - 07/18/24 1649       Rate Your Plate Scores   Pre Score 70         MEDIFICTS Score Key: >=70 Need to make dietary changes  40-70 Heart Healthy Diet <= 40 Therapeutic Level Cholesterol Diet   Flowsheet Row INTENSIVE CARDIAC REHAB from 07/18/2024 in [facility]  Picture Your Plate Total Score on Admission 70   Picture Your Plate Scores: <59 Unhealthy dietary pattern with much room for improvement. 41-50 Dietary pattern unlikely to meet recommendations for good health and room for improvement. 51-60 More healthful dietary pattern, with some room for improvement.  >60 Healthy dietary pattern, although there may be some specific behaviors that could be improved.    Nutrition Goals Re-Evaluation:   Nutrition Goals Re-Evaluation:   Nutrition Goals Discharge (Final Nutrition Goals Re-Evaluation):   Psychosocial: Target Goals: Acknowledge presence or absence of significant depression and/or stress, maximize coping skills, provide positive support system. Participant is able to verbalize types and ability to use techniques and skills needed for reducing stress and depression.  Initial Review & Psychosocial Screening:  Initial Psych Review & Screening - 07/10/24 1235       Initial  Review   Current issues with Current Stress Concerns    Source of Stress Concerns Chronic Illness;Unable to perform yard/household activities    Comments Debbera repots that she has frequent urination now that she is taking a diuretic. Discussed sleep hygiene with the patient and her husband      Family Dynamics   Good Support System? Yes   Owen has her husband, daughter, mother and firends for support. Estefani has a good support network     Barriers   Psychosocial barriers to participate in program The patient should benefit from training in stress management and relaxation.      Screening Interventions   Interventions Encouraged to exercise;To provide support and resources with identified psychosocial needs;Provide feedback about the scores to participant    Expected Outcomes Long Term Goal: Stressors or current issues are controlled or eliminated.;Short Term goal: Identification and review with participant of any Quality of Life or Depression concerns found by scoring the questionnaire.;Long Term goal: The participant improves quality of Life and PHQ9 Scores as seen by post scores and/or verbalization of changes          Quality of Life Scores:  Quality of Life - 07/10/24 1234       Quality of Life   Select Quality of Life      Quality of Life Scores   Health/Function Pre 25.2 %    Socioeconomic Pre 28.21 %    Psych/Spiritual Pre 26.57 %    Family Pre 30 %    GLOBAL Pre 26.81 %  Scores of 19 and below usually indicate a poorer quality of life in these areas.  A difference of  2-3 points is a clinically meaningful difference.  A difference of 2-3 points in the total score of the Quality of Life Index has been associated with significant improvement in overall quality of life, self-image, physical symptoms, and general health in studies assessing change in quality of life.  PHQ-9: Review Flowsheet       07/10/2024 03/15/2024  Depression screen PHQ 2/9  Decreased  Interest 0 0  Down, Depressed, Hopeless 0 0  PHQ - 2 Score 0 0  Altered sleeping 1 -  Tired, decreased energy 0 -  Change in appetite 0 -  Feeling bad or failure about yourself  0 -  Trouble concentrating 1 -  Moving slowly or fidgety/restless 0 -  Suicidal thoughts 0 -  PHQ-9 Score 2 -  Difficult doing work/chores Not difficult at all -   Interpretation of Total Score  Total Score Depression Severity:  1-4 = Minimal depression, 5-9 = Mild depression, 10-14 = Moderate depression, 15-19 = Moderately severe depression, 20-27 = Severe depression   Psychosocial Evaluation and Intervention:   Psychosocial Re-Evaluation:  Psychosocial Re-Evaluation     Row Name 07/31/24 1621 08/29/24 1053 09/25/24 1130         Psychosocial Re-Evaluation   Current issues with Current Stress Concerns Current Stress Concerns Current Stress Concerns     Comments Claudina did discuss that her life vest was going off and she has had it resized as she has lost a lot of weight Devanshi has not voiced any additional pyschosocial concerns or stressors during exercise at cardiac rehab. Dorean has not voiced any additional psychosocial concerns or stressors during exercise at cardiac rehab.     Expected Outcomes Maryam will have contolled or decreased stressors upon completion of cardiac rehab. Faryal will have contolled or decreased stressors upon completion of cardiac rehab. Alajah will have controlled or decreased stressors upon completion of cardiac rehab.     Interventions Stress management education;Encouraged to attend Cardiac Rehabilitation for the exercise;Relaxation education Stress management education;Encouraged to attend Cardiac Rehabilitation for the exercise;Relaxation education Stress management education;Encouraged to attend Cardiac Rehabilitation for the exercise;Relaxation education     Continue Psychosocial Services  Follow up required by staff Follow up required by staff Follow up required by staff        Initial Review   Source of Stress Concerns Chronic Illness -- --     Comments Will continue to monitor and offer support as needed -- --        Psychosocial Discharge (Final Psychosocial Re-Evaluation):  Psychosocial Re-Evaluation - 09/25/24 1130       Psychosocial Re-Evaluation   Current issues with Current Stress Concerns    Comments Aixa has not voiced any additional psychosocial concerns or stressors during exercise at cardiac rehab.    Expected Outcomes Eller will have controlled or decreased stressors upon completion of cardiac rehab.    Interventions Stress management education;Encouraged to attend Cardiac Rehabilitation for the exercise;Relaxation education    Continue Psychosocial Services  Follow up required by staff          Vocational Rehabilitation: Provide vocational rehab assistance to qualifying candidates.   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 Participant will state understanding/return demonstration of topics presented.    Education     Row Name 07/16/24 1500     Education   Cardiac Education Topics [facility]   Geographical Information Systems Officer Exercise   Exercise Workshop Exercise Basics: Diplomatic Services Operational Officer   Instruction Review Code 1- Verbalizes Understanding   Class Start Time 1420   Class Stop Time 1500   Class Time Calculation (min) 40 min    Row Name 07/18/24 1500     Education   Cardiac Education Topics [facility]   Customer Service Manager   Weekly Topic Efficiency Cooking - Meals in a Snap    Instruction Review Code 1- Verbalizes Understanding   Class Start Time 1400   Class Stop Time 1440   Class Time Calculation (min) 40 min    Row Name 07/20/24 1400     Education   Cardiac Education Topics [facility]   Psychologist, Forensic Exercise Education   Exercise Education Move It!   Instruction Review Code 1- Verbalizes Understanding   Class Start Time 1359   Class Stop Time 1432   Class Time Calculation (min) 33 min    Row Name 07/25/24 1400     Education   Cardiac Education Topics [facility]   Customer Service Manager   Weekly Topic One-Pot Wonders   Instruction Review Code 1- Verbalizes Understanding   Class Start Time 1400   Class Stop Time 1440   Class Time Calculation (min) 40 min    Row Name 07/27/24 1500     Education   Cardiac Education Topics [facility]   Writer General Education   General Education Hypertension and Heart Disease   Instruction Review Code 1- Verbalizes Understanding   Class Start Time 1403   Class Stop Time 1440   Class Time Calculation (min) 37 min    Row Name 07/30/24 1500     Education   Cardiac Education Topics [facility]   Geographical Information Systems Officer Psychosocial   Psychosocial Workshop Focused Goals, Sustainable Changes   Instruction Review Code 1- Verbalizes Understanding   Class Start Time 1359   Class Stop Time 1434   Class Time Calculation (min) 35 min    Row Name 08/01/24 1300     Education   Cardiac Education Topics Pritikin   Customer Service Manager   Weekly Topic Comforting Weekend Breakfasts   Instruction Review Code 1- Verbalizes Understanding   Class Start Time 1358   Class Stop Time 1439   Class Time Calculation (min) 41 min    Row Name 08/03/24 1300     Education    Cardiac Education Topics Pritikin   Nurse, Children's Exercise Physiologist   Select Nutrition   Nutrition Dining Out - Part 1   Instruction Review Code 1- Verbalizes Understanding  Class Start Time 1400   Class Stop Time 1440   Class Time Calculation (min) 40 min    Row Name 08/06/24 1300     Education   Cardiac Education Topics Pritikin   Select Core Videos     Core Videos   Educator Exercise Physiologist   Select Exercise Education   Exercise Education Biomechanial Limitations   Instruction Review Code 1- Verbalizes Understanding   Class Start Time 1405   Class Stop Time 1440   Class Time Calculation (min) 35 min    Row Name 08/08/24 1400     Education   Cardiac Education Topics Pritikin   Secondary School Teacher School   Educator Nurse;Respiratory Therapist   Weekly Topic Fast Evening Meals   Instruction Review Code 1- Verbalizes Understanding   Class Start Time 1356   Class Stop Time 1428   Class Time Calculation (min) 32 min    Row Name 08/10/24 1400     Education   Cardiac Education Topics Pritikin   Licensed Conveyancer Nutrition   Nutrition Vitamins and Minerals   Instruction Review Code 1- Verbalizes Understanding   Class Start Time 1357   Class Stop Time 1440   Class Time Calculation (min) 43 min    Row Name 08/13/24 1300     Education   Cardiac Education Topics Pritikin   Psychologist, Forensic Exercise Education   Exercise Education Improving Performance   Instruction Review Code 1- Verbalizes Understanding   Class Start Time 1400   Class Stop Time 1438   Class Time Calculation (min) 38 min    Row Name 08/15/24 1500     Education   Cardiac Education Topics Pritikin   Customer Service Manager   Weekly Topic International Cuisine- Spotlight on the [employer]  Zones   Instruction Review Code 1- Verbalizes Understanding   Class Start Time 1358   Class Stop Time 1435   Class Time Calculation (min) 37 min    Row Name 08/17/24 1300     Education   Cardiac Education Topics Pritikin   Glass Blower/designer Nutrition   Nutrition Workshop Fueling a Forensic Psychologist   Instruction Review Code 1- Tax Inspector   Class Start Time 1357   Class Stop Time 1433   Class Time Calculation (min) 36 min    Row Name 08/20/24 1400     Education   Cardiac Education Topics Pritikin   Geographical Information Systems Officer Psychosocial   Psychosocial Workshop Healthy Sleep for a Healthy Heart   Instruction Review Code 1- Tefl Teacher Understanding   Class Start Time 1400   Class Stop Time 1445   Class Time Calculation (min) 45 min    Row Name 08/22/24 1400     Education   Cardiac Education Topics Pritikin   Customer Service Manager   Weekly Topic Simple Sides and Sauces   Instruction Review Code 1- Verbalizes Understanding   Class Start Time 1400   Class Stop Time 1435   Class Time Calculation (min) 35 min    Row Name 08/24/24 1400  Education   Cardiac Education Topics Pritikin   Psychologist, Forensic Psychosocial   Psychosocial How Our Thoughts Can Heal Our Hearts   Instruction Review Code 1- Verbalizes Understanding   Class Start Time 1400   Class Stop Time 1436   Class Time Calculation (min) 36 min    Row Name 08/27/24 1300     Education   Cardiac Education Topics Pritikin   Hospital Doctor Education   General Education Heart Disease Risk Reduction   Instruction Review Code 1- Verbalizes Understanding   Class Start Time 1410   Class Stop Time 1450   Class Time Calculation (min) 40  min    Row Name 08/27/24 1500     Education   Cardiac Education Topics Pritikin    Row Name 08/29/24 1300     Education   Cardiac Education Topics Pritikin     Secondary School Teacher   Weekly Topic Powerhouse Plant-Based Proteins   Instruction Review Code 1- Verbalizes Understanding   Class Start Time 1400   Class Stop Time 1436   Class Time Calculation (min) 36 min    Row Name 08/31/24 1400     Education   Cardiac Education Topics Pritikin   Licensed Conveyancer Nutrition   Nutrition Facts on Fat   Instruction Review Code 1- Verbalizes Understanding   Class Start Time 1357   Class Stop Time 1436   Class Time Calculation (min) 39 min    Row Name 09/03/24 1400     Education   Cardiac Education Topics Pritikin   Geographical Information Systems Officer Psychosocial   Psychosocial Workshop From Head to Heart: The Power of a Healthy Outlook   Instruction Review Code 1- Verbalizes Understanding   Class Start Time 1400   Class Stop Time 1446   Class Time Calculation (min) 46 min    Row Name 09/05/24 1400     Education   Cardiac Education Topics Pritikin   Customer Service Manager   Weekly Topic Tasty Appetizers and Snacks   Instruction Review Code 1- Verbalizes Understanding   Class Start Time 1400   Class Stop Time 1442   Class Time Calculation (min) 42 min    Row Name 09/07/24 1400     Education   Cardiac Education Topics Pritikin   Select Workshops     Workshops   Educator Nurse   Select Exercise   Exercise Workshop Managing Heart Disease: Your Path to a Healthier Heart   Instruction Review Code 1- Verbalizes Understanding   Class Start Time 1405   Class Stop Time 1453   Class Time Calculation (min) 48 min    Row Name 09/10/24 1300     Education   Cardiac Education Topics Pritikin   Special Educational Needs Teacher   Educator Nurse   Select Psychosocial   Psychosocial Healthy Minds, Bodies, Hearts   Instruction Review Code 1- Verbalizes Understanding   Class Start Time 1400   Class Stop Time 1434   Class Time Calculation (min) 34 min    Row Name 09/12/24 1500  Education   Cardiac Education Topics Pritikin   Orthoptist   Educator Respiratory Therapist;Nurse   Weekly Topic Adding Flavor - Sodium-Free   Instruction Review Code 1- Verbalizes Understanding   Class Start Time 1357   Class Stop Time 1432   Class Time Calculation (min) 35 min    Row Name 09/14/24 1500     Education   Cardiac Education Topics Pritikin   Glass Blower/designer Nutrition   Nutrition Workshop Label Reading   Instruction Review Code 1- Verbalizes Understanding   Class Start Time 1400   Class Stop Time 1448   Class Time Calculation (min) 48 min    Row Name 09/17/24 1400     Education   Cardiac Education Topics Pritikin   Geographical Information Systems Officer Exercise   Exercise Workshop Location Manager and Fall Prevention   Instruction Review Code 1- Verbalizes Understanding   Class Start Time 1408   Class Stop Time 1500   Class Time Calculation (min) 52 min    Row Name 09/19/24 1400     Education   Cardiac Education Topics Pritikin   Customer Service Manager   Weekly Topic Fast and Healthy Breakfasts   Instruction Review Code 1- Verbalizes Understanding   Class Start Time 1400   Class Stop Time 1442   Class Time Calculation (min) 42 min    Row Name 09/21/24 1400     Education   Cardiac Education Topics Pritikin   Licensed Conveyancer Nutrition   Nutrition Other  Label Reading   Instruction Review Code 1- Verbalizes Understanding   Class Start Time 1358   Class Stop Time 1456   Class  Time Calculation (min) 58 min    Row Name 09/24/24 1400     Education   Cardiac Education Topics Pritikin   Hospital Doctor Education   General Education Metabolic Syndrome and Belly Fat   Instruction Review Code 1- Verbalizes Understanding   Class Start Time 1407   Class Stop Time 1445   Class Time Calculation (min) 38 min      Core Videos: Exercise    Move It!  Clinical staff conducted group or individual video education with verbal and written material and guidebook.  Patient learns the recommended Pritikin exercise program. Exercise with the goal of living a long, healthy life. Some of the health benefits of exercise include controlled diabetes, healthier blood pressure levels, improved cholesterol levels, improved heart and lung capacity, improved sleep, and better body composition. Everyone should speak with their doctor before starting or changing an exercise routine.  Biomechanical Limitations Clinical staff conducted group or individual video education with verbal and written material and guidebook.  Patient learns how biomechanical limitations can impact exercise and how we can mitigate and possibly overcome limitations to have an impactful and balanced exercise routine.  Body Composition Clinical staff conducted group or individual video education with verbal and written material and guidebook.  Patient learns that body composition (ratio of muscle mass to fat mass) is a key component to assessing overall fitness, rather than body weight alone. Increased fat mass, especially visceral  belly fat, can put us  at increased risk for metabolic syndrome, type 2 diabetes, heart disease, and even death. It is recommended to combine diet and exercise (cardiovascular and resistance training) to improve your body composition. Seek guidance from your physician and exercise physiologist before implementing an exercise  routine.  Exercise Action Plan Clinical staff conducted group or individual video education with verbal and written material and guidebook.  Patient learns the recommended strategies to achieve and enjoy long-term exercise adherence, including variety, self-motivation, self-efficacy, and positive decision making. Benefits of exercise include fitness, good health, weight management, more energy, better sleep, less stress, and overall well-being.  Medical   Heart Disease Risk Reduction Clinical staff conducted group or individual video education with verbal and written material and guidebook.  Patient learns our heart is our most vital organ as it circulates oxygen, nutrients, white blood cells, and hormones throughout the entire body, and carries waste away. Data supports a plant-based eating plan like the Pritikin Program for its effectiveness in slowing progression of and reversing heart disease. The video provides a number of recommendations to address heart disease.   Metabolic Syndrome and Belly Fat  Clinical staff conducted group or individual video education with verbal and written material and guidebook.  Patient learns what metabolic syndrome is, how it leads to heart disease, and how one can reverse it and keep it from coming back. You have metabolic syndrome if you have 3 of the following 5 criteria: abdominal obesity, high blood pressure, high triglycerides, low HDL cholesterol, and high blood sugar.  Hypertension and Heart Disease Clinical staff conducted group or individual video education with verbal and written material and guidebook.  Patient learns that high blood pressure, or hypertension, is very common in the [place] . Hypertension is largely due to excessive salt intake, but other important risk factors include being overweight, physical inactivity, drinking too much alcohol, smoking, and not eating enough potassium from fruits and vegetables. High blood pressure is a  leading risk factor for heart attack, stroke, congestive heart failure, dementia, kidney failure, and premature death. Long-term effects of excessive salt intake include stiffening of the arteries and thickening of heart muscle and organ damage. Recommendations include ways to reduce hypertension and the risk of heart disease.  Diseases of Our Time - Focusing on Diabetes Clinical staff conducted group or individual video education with verbal and written material and guidebook.  Patient learns why the best way to stop diseases of our time is prevention, through food and other lifestyle changes. Medicine (such as prescription pills and surgeries) is often only a Band-Aid on the problem, not a long-term solution. Most common diseases of our time include obesity, type 2 diabetes, hypertension, heart disease, and cancer. The Pritikin Program is recommended and has been proven to help reduce, reverse, and/or prevent the damaging effects of metabolic syndrome.  Nutrition   Overview of the Pritikin Eating Plan  Clinical staff conducted group or individual video education with verbal and written material and guidebook.  Patient learns about the Pritikin Eating Plan for disease risk reduction. The Pritikin Eating Plan emphasizes a wide variety of unrefined, minimally-processed carbohydrates, like fruits, vegetables, whole grains, and legumes. Go, Caution, and Stop food choices are explained. Plant-based and lean animal proteins are emphasized. Rationale provided for low sodium intake for blood pressure control, low added sugars for blood sugar stabilization, and low added fats and oils for coronary artery disease risk reduction and weight management.  Calorie Density  Clinical staff conducted group  or individual video education with verbal and written material and guidebook.  Patient learns about calorie density and how it impacts the Pritikin Eating Plan. Knowing the characteristics of the food you choose will  help you decide whether those foods will lead to weight gain or weight loss, and whether you want to consume more or less of them. Weight loss is usually a side effect of the Pritikin Eating Plan because of its focus on low calorie-dense foods.  Label Reading  Clinical staff conducted group or individual video education with verbal and written material and guidebook.  Patient learns about the Pritikin recommended label reading guidelines and corresponding recommendations regarding calorie density, added sugars, sodium content, and whole grains.  Dining Out - Part 1  Clinical staff conducted group or individual video education with verbal and written material and guidebook.  Patient learns that restaurant meals can be sabotaging because they can be so high in calories, fat, sodium, and/or sugar. Patient learns recommended strategies on how to positively address this and avoid unhealthy pitfalls.  Facts on Fats  Clinical staff conducted group or individual video education with verbal and written material and guidebook.  Patient learns that lifestyle modifications can be just as effective, if not more so, as many medications for lowering your risk of heart disease. A Pritikin lifestyle can help to reduce your risk of inflammation and atherosclerosis (cholesterol build-up, or plaque, in the artery walls). Lifestyle interventions such as dietary choices and physical activity address the cause of atherosclerosis. A review of the types of fats and their impact on blood cholesterol levels, along with dietary recommendations to reduce fat intake is also included.  Nutrition Action Plan  Clinical staff conducted group or individual video education with verbal and written material and guidebook.  Patient learns how to incorporate Pritikin recommendations into their lifestyle. Recommendations include planning and keeping personal health goals in mind as an important part of their success.  Healthy Mind-Set     Healthy Minds, Bodies, Hearts  Clinical staff conducted group or individual video education with verbal and written material and guidebook.  Patient learns how to identify when they are stressed. Video will discuss the impact of that stress, as well as the many benefits of stress management. Patient will also be introduced to stress management techniques. The way we think, act, and feel has an impact on our hearts.  How Our Thoughts Can Heal Our Hearts  Clinical staff conducted group or individual video education with verbal and written material and guidebook.  Patient learns that negative thoughts can cause depression and anxiety. This can result in negative lifestyle behavior and serious health problems. Cognitive behavioral therapy is an effective method to help control our thoughts in order to change and improve our emotional outlook.  Additional Videos:  Exercise    Improving Performance  Clinical staff conducted group or individual video education with verbal and written material and guidebook.  Patient learns to use a non-linear approach by alternating intensity levels and lengths of time spent exercising to help burn more calories and lose more body fat. Cardiovascular exercise helps improve heart health, metabolism, hormonal balance, blood sugar control, and recovery from fatigue. Resistance training improves strength, endurance, balance, coordination, reaction time, metabolism, and muscle mass. Flexibility exercise improves circulation, posture, and balance. Seek guidance from your physician and exercise physiologist before implementing an exercise routine and learn your capabilities and proper form for all exercise.  Introduction to Yoga  Clinical staff conducted group or individual video  education with verbal and written material and guidebook.  Patient learns about yoga, a discipline of the coming together of mind, breath, and body. The benefits of yoga include improved flexibility,  improved range of motion, better posture and core strength, increased lung function, weight loss, and positive self-image. Yoga's heart health benefits include lowered blood pressure, healthier heart rate, decreased cholesterol and triglyceride levels, improved immune function, and reduced stress. Seek guidance from your physician and exercise physiologist before implementing an exercise routine and learn your capabilities and proper form for all exercise.  Medical   Aging: Enhancing Your Quality of Life  Clinical staff conducted group or individual video education with verbal and written material and guidebook.  Patient learns key strategies and recommendations to stay in good physical health and enhance quality of life, such as prevention strategies, having an advocate, securing a Health Care Proxy and Power of Attorney, and keeping a list of medications and system for tracking them. It also discusses how to avoid risk for bone loss.  Biology of Weight Control  Clinical staff conducted group or individual video education with verbal and written material and guidebook.  Patient learns that weight gain occurs because we consume more calories than we burn (eating more, moving less). Even if your body weight is normal, you may have higher ratios of fat compared to muscle mass. Too much body fat puts you at increased risk for cardiovascular disease, heart attack, stroke, type 2 diabetes, and obesity-related cancers. In addition to exercise, following the Pritikin Eating Plan can help reduce your risk.  Decoding Lab Results  Clinical staff conducted group or individual video education with verbal and written material and guidebook.  Patient learns that lab test reflects one measurement whose values change over time and are influenced by many factors, including medication, stress, sleep, exercise, food, hydration, pre-existing medical conditions, and more. It is recommended to use the knowledge from this  video to become more involved with your lab results and evaluate your numbers to speak with your doctor.   Diseases of Our Time - Overview  Clinical staff conducted group or individual video education with verbal and written material and guidebook.  Patient learns that according to the CDC, 50% to 70% of chronic diseases (such as obesity, type 2 diabetes, elevated lipids, hypertension, and heart disease) are avoidable through lifestyle improvements including healthier food choices, listening to satiety cues, and increased physical activity.  Sleep Disorders Clinical staff conducted group or individual video education with verbal and written material and guidebook.  Patient learns how good quality and duration of sleep are important to overall health and well-being. Patient also learns about sleep disorders and how they impact health along with recommendations to address them, including discussing with a physician.  Nutrition  Dining Out - Part 2 Clinical staff conducted group or individual video education with verbal and written material and guidebook.  Patient learns how to plan ahead and communicate in order to maximize their dining experience in a healthy and nutritious manner. Included are recommended food choices based on the type of restaurant the patient is visiting.   Fueling a Banker conducted group or individual video education with verbal and written material and guidebook.  There is a strong connection between our food choices and our health. Diseases like obesity and type 2 diabetes are very prevalent and are in large-part due to lifestyle choices. The Pritikin Eating Plan provides plenty of food and hunger-curbing satisfaction. It is easy to  follow, affordable, and helps reduce health risks.  Menu Workshop  Clinical staff conducted group or individual video education with verbal and written material and guidebook.  Patient learns that restaurant meals can  sabotage health goals because they are often packed with calories, fat, sodium, and sugar. Recommendations include strategies to plan ahead and to communicate with the manager, chef, or server to help order a healthier meal.  Planning Your Eating Strategy  Clinical staff conducted group or individual video education with verbal and written material and guidebook.  Patient learns about the Pritikin Eating Plan and its benefit of reducing the risk of disease. The Pritikin Eating Plan does not focus on calories. Instead, it emphasizes high-quality, nutrient-rich foods. By knowing the characteristics of the foods, we choose, we can determine their calorie density and make informed decisions.  Targeting Your Nutrition Priorities  Clinical staff conducted group or individual video education with verbal and written material and guidebook.  Patient learns that lifestyle habits have a tremendous impact on disease risk and progression. This video provides eating and physical activity recommendations based on your personal health goals, such as reducing LDL cholesterol, losing weight, preventing or controlling type 2 diabetes, and reducing high blood pressure.  Vitamins and Minerals  Clinical staff conducted group or individual video education with verbal and written material and guidebook.  Patient learns different ways to obtain key vitamins and minerals, including through a recommended healthy diet. It is important to discuss all supplements you take with your doctor.   Healthy Mind-Set    Smoking Cessation  Clinical staff conducted group or individual video education with verbal and written material and guidebook.  Patient learns that cigarette smoking and tobacco addiction pose a serious health risk which affects millions of people. Stopping smoking will significantly reduce the risk of heart disease, lung disease, and many forms of cancer. Recommended strategies for quitting are covered, including  working with your doctor to develop a successful plan.  Culinary   Becoming a Set Designer conducted group or individual video education with verbal and written material and guidebook.  Patient learns that cooking at home can be healthy, cost-effective, quick, and puts them in control. Keys to cooking healthy recipes will include looking at your recipe, assessing your equipment needs, planning ahead, making it simple, choosing cost-effective seasonal ingredients, and limiting the use of added fats, salts, and sugars.  Cooking - Breakfast and Snacks  Clinical staff conducted group or individual video education with verbal and written material and guidebook.  Patient learns how important breakfast is to satiety and nutrition through the entire day. Recommendations include key foods to eat during breakfast to help stabilize blood sugar levels and to prevent overeating at meals later in the day. Planning ahead is also a key component.  Cooking - Educational Psychologist conducted group or individual video education with verbal and written material and guidebook.  Patient learns eating strategies to improve overall health, including an approach to cook more at home. Recommendations include thinking of animal protein as a side on your plate rather than center stage and focusing instead on lower calorie dense options like vegetables, fruits, whole grains, and plant-based proteins, such as beans. Making sauces in large quantities to freeze for later and leaving the skin on your vegetables are also recommended to maximize your experience.  Cooking - Healthy Salads and Dressing Clinical staff conducted group or individual video education with verbal and written material and guidebook.  Patient  learns that vegetables, fruits, whole grains, and legumes are the foundations of the Pritikin Eating Plan. Recommendations include how to incorporate each of these in flavorful and healthy  salads, and how to create homemade salad dressings. Proper handling of ingredients is also covered. Cooking - Soups and [employer] - Soups and Desserts Clinical staff conducted group or individual video education with verbal and written material and guidebook.  Patient learns that Pritikin soups and desserts make for easy, nutritious, and delicious snacks and meal components that are low in sodium, fat, sugar, and calorie density, while high in vitamins, minerals, and filling fiber. Recommendations include simple and healthy ideas for soups and desserts.   Overview     The Pritikin Solution Program Overview Clinical staff conducted group or individual video education with verbal and written material and guidebook.  Patient learns that the results of the Pritikin Program have been documented in more than 100 articles published in peer-reviewed journals, and the benefits include reducing risk factors for (and, in some cases, even reversing) high cholesterol, high blood pressure, type 2 diabetes, obesity, and more! An overview of the three key pillars of the Pritikin Program will be covered: eating well, doing regular exercise, and having a healthy mind-set.  WORKSHOPS  Exercise: Exercise Basics: Building Your Action Plan Clinical staff led group instruction and group discussion with PowerPoint presentation and patient guidebook. To enhance the learning environment the use of posters, models and videos may be added. At the conclusion of this workshop, patients will comprehend the difference between physical activity and exercise, as well as the benefits of incorporating both, into their routine. Patients will understand the FITT (Frequency, Intensity, Time, and Type) principle and how to use it to build an exercise action plan. In addition, safety concerns and other considerations for exercise and cardiac rehab will be addressed by the presenter. The purpose of this lesson is to promote a  comprehensive and effective weekly exercise routine in order to improve patients' overall level of fitness.   Managing Heart Disease: Your Path to a Healthier Heart Clinical staff led group instruction and group discussion with PowerPoint presentation and patient guidebook. To enhance the learning environment the use of posters, models and videos may be added.At the conclusion of this workshop, patients will understand the anatomy and physiology of the heart. Additionally, they will understand how Pritikin's three pillars impact the risk factors, the progression, and the management of heart disease.  The purpose of this lesson is to provide a high-level overview of the heart, heart disease, and how the Pritikin lifestyle positively impacts risk factors.  Exercise Biomechanics Clinical staff led group instruction and group discussion with PowerPoint presentation and patient guidebook. To enhance the learning environment the use of posters, models and videos may be added. Patients will learn how the structural parts of their bodies function and how these functions impact their daily activities, movement, and exercise. Patients will learn how to promote a neutral spine, learn how to manage pain, and identify ways to improve their physical movement in order to promote healthy living. The purpose of this lesson is to expose patients to common physical limitations that impact physical activity. Participants will learn practical ways to adapt and manage aches and pains, and to minimize their effect on regular exercise. Patients will learn how to maintain good posture while sitting, walking, and lifting.  Balance Training and Fall Prevention  Clinical staff led group instruction and group discussion with PowerPoint presentation and patient guidebook. To  enhance the learning environment the use of posters, models and videos may be added. At the conclusion of this workshop, patients will understand the  importance of their sensorimotor skills (vision, proprioception, and the vestibular system) in maintaining their ability to balance as they age. Patients will apply a variety of balancing exercises that are appropriate for their current level of function. Patients will understand the common causes for poor balance, possible solutions to these problems, and ways to modify their physical environment in order to minimize their fall risk. The purpose of this lesson is to teach patients about the importance of maintaining balance as they age and ways to minimize their risk of falling.  WORKSHOPS   Nutrition:  Fueling a Ship Broker led group instruction and group discussion with PowerPoint presentation and patient guidebook. To enhance the learning environment the use of posters, models and videos may be added. Patients will review the foundational principles of the Pritikin Eating Plan and understand what constitutes a serving size in each of the food groups. Patients will also learn Pritikin-friendly foods that are better choices when away from home and review make-ahead meal and snack options. Calorie density will be reviewed and applied to three nutrition priorities: weight maintenance, weight loss, and weight gain. The purpose of this lesson is to reinforce (in a group setting) the key concepts around what patients are recommended to eat and how to apply these guidelines when away from home by planning and selecting Pritikin-friendly options. Patients will understand how calorie density may be adjusted for different weight management goals.  Mindful Eating  Clinical staff led group instruction and group discussion with PowerPoint presentation and patient guidebook. To enhance the learning environment the use of posters, models and videos may be added. Patients will briefly review the concepts of the Pritikin Eating Plan and the importance of low-calorie dense foods. The concept of mindful  eating will be introduced as well as the importance of paying attention to internal hunger signals. Triggers for non-hunger eating and techniques for dealing with triggers will be explored. The purpose of this lesson is to provide patients with the opportunity to review the basic principles of the Pritikin Eating Plan, discuss the value of eating mindfully and how to measure internal cues of hunger and fullness using the Hunger Scale. Patients will also discuss reasons for non-hunger eating and learn strategies to use for controlling emotional eating.  Targeting Your Nutrition Priorities Clinical staff led group instruction and group discussion with PowerPoint presentation and patient guidebook. To enhance the learning environment the use of posters, models and videos may be added. Patients will learn how to determine their genetic susceptibility to disease by reviewing their family history. Patients will gain insight into the importance of diet as part of an overall healthy lifestyle in mitigating the impact of genetics and other environmental insults. The purpose of this lesson is to provide patients with the opportunity to assess their personal nutrition priorities by looking at their family history, their own health history and current risk factors. Patients will also be able to discuss ways of prioritizing and modifying the Pritikin Eating Plan for their highest risk areas  Menu  Clinical staff led group instruction and group discussion with PowerPoint presentation and patient guidebook. To enhance the learning environment the use of posters, models and videos may be added. Using menus brought in from [employer], or printed from [employer], patients will apply the Pritikin dining out guidelines that were presented in the  Pritikin Insurance Claims Handler. Patients will also be able to practice these guidelines in a variety of provided scenarios. The purpose of this lesson is to provide  patients with the opportunity to practice hands-on learning of the Pritikin Dining Out guidelines with actual menus and practice scenarios.  Label Reading Clinical staff led group instruction and group discussion with PowerPoint presentation and patient guidebook. To enhance the learning environment the use of posters, models and videos may be added. Patients will review and discuss the Pritikin label reading guidelines presented in Pritikin's Label Reading Educational series video. Using fool labels brought in from local grocery stores and markets, patients will apply the label reading guidelines and determine if the packaged food meet the Pritikin guidelines. The purpose of this lesson is to provide patients with the opportunity to review, discuss, and practice hands-on learning of the Pritikin Label Reading guidelines with actual packaged food labels. Cooking School  Pritikin's [employer] are designed to teach patients ways to prepare quick, simple, and affordable recipes at home. The importance of nutrition's role in chronic disease risk reduction is reflected in its emphasis in the overall Pritikin program. By learning how to prepare essential core Pritikin Eating Plan recipes, patients will increase control over what they eat; be able to customize the flavor of foods without the use of added salt, sugar, or fat; and improve the quality of the food they consume. By learning a set of core recipes which are easily assembled, quickly prepared, and affordable, patients are more likely to prepare more healthy foods at home. These workshops focus on convenient breakfasts, simple entres, side dishes, and desserts which can be prepared with minimal effort and are consistent with nutrition recommendations for cardiovascular risk reduction. Cooking [employer] are taught by a armed forces logistics/support/administrative officer (RD) who has been trained by the [employer]. The chef or RD has a clear  understanding of the importance of minimizing - if not completely eliminating - added fat, sugar, and sodium in recipes. Throughout the series of Cooking School Workshop sessions, patients will learn about healthy ingredients and efficient methods of cooking to build confidence in their capability to prepare    Cooking School weekly topics:  Adding Flavor- Sodium-Free  Fast and Healthy Breakfasts  Powerhouse Plant-Based Proteins  Satisfying Salads and Dressings  Simple Sides and Sauces  International Cuisine-Spotlight on the [employer] Zones  Delicious Desserts  Savory Soups  [employer] - Meals in a Astronomer Appetizers and Snacks  Comforting Weekend Breakfasts  One-Pot Wonders   Fast Evening Meals  Landscape Architect Your Pritikin Plate  WORKSHOPS   Healthy Mindset (Psychosocial):  Focused Goals, Sustainable Changes Clinical staff led group instruction and group discussion with PowerPoint presentation and patient guidebook. To enhance the learning environment the use of posters, models and videos may be added. Patients will be able to apply effective goal setting strategies to establish at least one personal goal, and then take consistent, meaningful action toward that goal. They will learn to identify common barriers to achieving personal goals and develop strategies to overcome them. Patients will also gain an understanding of how our mind-set can impact our ability to achieve goals and the importance of cultivating a positive and growth-oriented mind-set. The purpose of this lesson is to provide patients with a deeper understanding of how to set and achieve personal goals, as well as the tools and strategies needed to overcome common obstacles which may arise along the  way.  From Head to Heart: The Power of a Healthy Outlook  Clinical staff led group instruction and group discussion with PowerPoint presentation and patient guidebook. To enhance the learning  environment the use of posters, models and videos may be added. Patients will be able to recognize and describe the impact of emotions and mood on physical health. They will discover the importance of self-care and explore self-care practices which may work for them. Patients will also learn how to utilize the 4 C's to cultivate a healthier outlook and better manage stress and challenges. The purpose of this lesson is to demonstrate to patients how a healthy outlook is an essential part of maintaining good health, especially as they continue their cardiac rehab journey.  Healthy Sleep for a Healthy Heart Clinical staff led group instruction and group discussion with PowerPoint presentation and patient guidebook. To enhance the learning environment the use of posters, models and videos may be added. At the conclusion of this workshop, patients will be able to demonstrate knowledge of the importance of sleep to overall health, well-being, and quality of life. They will understand the symptoms of, and treatments for, common sleep disorders. Patients will also be able to identify daytime and nighttime behaviors which impact sleep, and they will be able to apply these tools to help manage sleep-related challenges. The purpose of this lesson is to provide patients with a general overview of sleep and outline the importance of quality sleep. Patients will learn about a few of the most common sleep disorders. Patients will also be introduced to the concept of "sleep hygiene," and discover ways to self-manage certain sleeping problems through simple daily behavior changes. Finally, the workshop will motivate patients by clarifying the links between quality sleep and their goals of heart-healthy living.   Recognizing and Reducing Stress Clinical staff led group instruction and group discussion with PowerPoint presentation and patient guidebook. To enhance the learning environment the use of posters, models and videos  may be added. At the conclusion of this workshop, patients will be able to understand the types of stress reactions, differentiate between acute and chronic stress, and recognize the impact that chronic stress has on their health. They will also be able to apply different coping mechanisms, such as reframing negative self-talk. Patients will have the opportunity to practice a variety of stress management techniques, such as deep abdominal breathing, progressive muscle relaxation, and/or guided imagery.  The purpose of this lesson is to educate patients on the role of stress in their lives and to provide healthy techniques for coping with it.  Learning Barriers/Preferences:  Learning Barriers/Preferences - 07/10/24 1243       Learning Barriers/Preferences   Learning Barriers Sight   wears glasses   Learning Preferences Video;Pictoral          Education Topics:  Knowledge Questionnaire Score:  Knowledge Questionnaire Score - 07/10/24 1234       Knowledge Questionnaire Score   Pre Score 24/24          Core Components/Risk Factors/Patient Goals at Admission:  Personal Goals and Risk Factors at Admission - 07/10/24 1144       Core Components/Risk Factors/Patient Goals on Admission   Lipids Yes    Intervention Provide education and support for participant on nutrition & aerobic/resistive exercise along with prescribed medications to achieve LDL 70mg , HDL >40mg .    Expected Outcomes Short Term: Participant states understanding of desired cholesterol values and is compliant with medications prescribed. Participant is following exercise  prescription and nutrition guidelines.;Long Term: Cholesterol controlled with medications as prescribed, with individualized exercise RX and with personalized nutrition plan. Value goals: LDL < 70mg , HDL > 40 mg.          Core Components/Risk Factors/Patient Goals Review:   Goals and Risk Factor Review     Row Name 07/31/24 1624 08/29/24 1054  09/25/24 1130         Core Components/Risk Factors/Patient Goals Review   Personal Goals Review Weight Management/Obesity;Lipids;Heart Failure Weight Management/Obesity;Lipids;Heart Failure Weight Management/Obesity;Lipids;Heart Failure     Review Joya has been doing well with exercise. Srija's vital signs have been stable. Halsey has increased her met levels. Ashika has been doing well with exercise. VSS. Vita has increased her met levels in the last 30 days Kristeen has been doing well with exercise. VSS. Sivan has increased her met levels again over the last 30 days     Expected Outcomes Chakita will continue to participate in cardiac rehab for exercise, nutrition and lifestyle modificaitons. Janat will continue to participate in cardiac rehab for exercise, nutrition and lifestyle modificaitons. Sabah will continue to participate in cardiac rehab for exercise, nutrition and lifestyle modificaitons.        Core Components/Risk Factors/Patient Goals at Discharge (Final Review):   Goals and Risk Factor Review - 09/25/24 1130       Core Components/Risk Factors/Patient Goals Review   Personal Goals Review Weight Management/Obesity;Lipids;Heart Failure    Review Devonda has been doing well with exercise. VSS. Timeka has increased her met levels again over the last 30 days    Expected Outcomes Celes will continue to participate in cardiac rehab for exercise, nutrition and lifestyle modificaitons.          ITP Comments:  ITP Comments     Row Name 07/10/24 1030 07/31/24 1619 08/29/24 1052 09/25/24 1129     ITP Comments Medical Director- Dr. Wilbert Bihari, MD. Pritikin Education Program/ Intensive Cardiac Rehab Program. Initial orientation folder reviewed with Astrid. 30 Day ITP Review. Valeen started cardiac rehab on 07/16/24. Kerline is off to a good start to exercise. 30 Day ITP Review. Jetaun has good attendance and participation with exercise at cardiac rehab 30 Day ITP Review. Ilena continues to have  good attendance and participation with exercise at cardiac rehab       Comments: see ITP comments

## 2024-09-26 ENCOUNTER — Encounter (HOSPITAL_COMMUNITY)
Admission: RE | Admit: 2024-09-26 | Discharge: 2024-09-26 | Disposition: A | Discharge status: Home / Self Care | Source: Ambulatory Visit | Attending: Cardiology | Admitting: Cardiology

## 2024-09-26 DIAGNOSIS — Z955 Presence of coronary angioplasty implant and graft: Secondary | ICD-10-CM | POA: Diagnosis not present

## 2024-09-26 DIAGNOSIS — I213 ST elevation (STEMI) myocardial infarction of unspecified site: Secondary | ICD-10-CM

## 2024-09-28 ENCOUNTER — Encounter (HOSPITAL_COMMUNITY)
Admission: RE | Admit: 2024-09-28 | Discharge: 2024-09-28 | Disposition: A | Discharge status: Home / Self Care | Source: Ambulatory Visit | Attending: Cardiology | Admitting: Cardiology

## 2024-09-28 VITALS — Ht 65.0 in | Wt 138.9 lb

## 2024-09-28 DIAGNOSIS — Z955 Presence of coronary angioplasty implant and graft: Secondary | ICD-10-CM

## 2024-09-28 DIAGNOSIS — I213 ST elevation (STEMI) myocardial infarction of unspecified site: Secondary | ICD-10-CM

## 2024-09-29 ENCOUNTER — Other Ambulatory Visit (HOSPITAL_COMMUNITY): Payer: Self-pay | Admitting: Cardiology

## 2024-10-01 ENCOUNTER — Encounter (HOSPITAL_COMMUNITY)
Admission: RE | Admit: 2024-10-01 | Discharge: 2024-10-01 | Disposition: A | Source: Ambulatory Visit | Attending: Cardiology

## 2024-10-01 DIAGNOSIS — Z955 Presence of coronary angioplasty implant and graft: Secondary | ICD-10-CM | POA: Diagnosis not present

## 2024-10-01 DIAGNOSIS — I213 ST elevation (STEMI) myocardial infarction of unspecified site: Secondary | ICD-10-CM

## 2024-10-03 ENCOUNTER — Encounter (HOSPITAL_COMMUNITY)
Admission: RE | Admit: 2024-10-03 | Discharge: 2024-10-03 | Disposition: A | Discharge status: Home / Self Care | Source: Ambulatory Visit | Attending: Cardiology | Admitting: Cardiology

## 2024-10-03 DIAGNOSIS — Z955 Presence of coronary angioplasty implant and graft: Secondary | ICD-10-CM | POA: Diagnosis not present

## 2024-10-03 DIAGNOSIS — I213 ST elevation (STEMI) myocardial infarction of unspecified site: Secondary | ICD-10-CM

## 2024-10-03 NOTE — Progress Notes (Signed)
 Discharge Progress Report  Patient Details  Name: Maria Zamora MRN: [id number] Date of Birth: [birth year] Referring Provider:   Flowsheet Row INTENSIVE CARDIAC REHAB ORIENT from 07/10/2024 in [facility]  Referring Provider Ladona Heinz, MD     Number of Visits: 23  Reason for Discharge:  Patient reached a stable level of exercise. Patient independent in their exercise. Patient has met program and personal goals.  Smoking History:  Social History   Tobacco Use  Smoking Status Never  Smokeless Tobacco Never    Diagnosis:  06/05/24 STEMI  06/05/24 DES LAD, D1, PTA  ADL UCSD:   Initial Exercise Prescription:  Initial Exercise Prescription - 07/10/24 1200       Date of Initial Exercise RX and Referring Provider   Date 07/10/24    Referring Provider Ladona Heinz, MD    Expected Discharge Date 10/03/24      Bike   Level 1    Minutes 15    METs 2.5      NuStep   Level 1    SPM 85    Minutes 15    METs 2.5      Prescription Details   Frequency (times per week) 3    Duration Progress to 30 minutes of continuous aerobic without signs/symptoms of physical distress      Intensity   THRR 40-80% of Max Heartrate 64-128    Ratings of Perceived Exertion 11-13    Perceived Dyspnea 0-4      Progression   Progression Continue to progress workloads to maintain intensity without signs/symptoms of physical distress.      Resistance Training   Training Prescription Yes    Weight 2 lbs    Reps 10-15          Discharge Exercise Prescription (Final Exercise Prescription Changes):  Exercise Prescription Changes - 09/21/24 1657       Response to Exercise   Blood Pressure (Admit) 98/50    Blood Pressure (Exit) 90/58    Heart Rate (Admit) 65 bpm    Heart Rate (Exercise) 124 bpm    Heart Rate (Exit) 76 bpm    Rating of Perceived Exertion (Exercise) 9    Perceived Dyspnea (Exercise) 0    Symptoms none     Comments REVD MET and goals    Duration Progress to 30 minutes of  aerobic without signs/symptoms of physical distress    Intensity THRR unchanged      Progression   Progression Continue to progress workloads to maintain intensity without signs/symptoms of physical distress.    Average METs 4.9      Resistance Training   Training Prescription Yes    Weight 3 lbs wts    Reps 10-15    Time 10 Minutes      Bike   Level 4.5    Watts 67    Minutes 15    METs 5.2      NuStep   Level 5    SPM 107    Minutes 15    METs 4.6          Functional Capacity:  6 Minute Walk     Row Name 07/10/24 1154 09/28/24 1725       6 Minute Walk   Phase Initial --    Distance 1566 feet 1800 feet    Distance % Change -- 14.94 %    Distance Feet Change -- 234 ft  Walk Time 6 minutes 6 minutes    # of Rest Breaks 0 0    MPH 2.96 3.41    METS 3.82 4.65    RPE 9 9    Perceived Dyspnea  0 0    VO2 Peak 13.37 16.28    Symptoms No No    Resting HR 77 bpm 72 bpm    Resting BP 102/64 91/55    Resting Oxygen Saturation  98 % --    Exercise Oxygen Saturation  during 6 min walk 98 % --    Max Ex. HR 101 bpm 103 bpm    Max Ex. BP 100/60 122/74    2 Minute Post BP 102/60 --       Psychological, QOL, Others - Outcomes: PHQ 2/9:    10/03/2024    3:33 PM 07/10/2024   12:24 PM 03/15/2024    9:03 AM  Depression screen PHQ 2/9  Decreased Interest 0 0 0  Down, Depressed, Hopeless 0 0 0  PHQ - 2 Score 0 0 0  Altered sleeping 0 1   Tired, decreased energy 0 0   Change in appetite 0 0   Feeling bad or failure about yourself  0 0   Trouble concentrating 0 1   Moving slowly or fidgety/restless 0 0   Suicidal thoughts 0 0   PHQ-9 Score 0 2    Difficult doing work/chores Not difficult at all Not difficult at all      Data saved with a previous flowsheet row definition    Quality of Life:  Quality of Life - 09/28/24 1727       Quality of Life   Select Quality of Life      Quality of  Life Scores   Health/Function Post 30 %    Socioeconomic Post 28.21 %    Psych/Spiritual Post 30 %    Family Post 30 %    GLOBAL Post 29.63 %          Personal Goals: Goals established at orientation with interventions provided to work toward goal.  Personal Goals and Risk Factors at Admission - 07/10/24 1144       Core Components/Risk Factors/Patient Goals on Admission   Lipids Yes    Intervention Provide education and support for participant on nutrition & aerobic/resistive exercise along with prescribed medications to achieve LDL 70mg , HDL >40mg .    Expected Outcomes Short Term: Participant states understanding of desired cholesterol values and is compliant with medications prescribed. Participant is following exercise prescription and nutrition guidelines.;Long Term: Cholesterol controlled with medications as prescribed, with individualized exercise RX and with personalized nutrition plan. Value goals: LDL < 70mg , HDL > 40 mg.           Personal Goals Discharge:  Goals and Risk Factor Review     Row Name 07/31/24 1624 08/29/24 1054 09/25/24 1130         Core Components/Risk Factors/Patient Goals Review   Personal Goals Review Weight Management/Obesity;Lipids;Heart Failure Weight Management/Obesity;Lipids;Heart Failure Weight Management/Obesity;Lipids;Heart Failure     Review Maria Zamora has been doing well with exercise. Maria Zamora's vital signs have been stable. Maria Zamora has increased her met levels. Maria Zamora has been doing well with exercise. VSS. Maria Zamora has increased her met levels in the last 30 days Maria Zamora has been doing well with exercise. VSS. Maria Zamora has increased her met levels again over the last 30 days     Expected Outcomes Maria Zamora will continue to participate in cardiac rehab for exercise, nutrition  and lifestyle modificaitons. Maria Zamora will continue to participate in cardiac rehab for exercise, nutrition and lifestyle modificaitons. Maria Zamora will continue to participate in cardiac rehab for  exercise, nutrition and lifestyle modificaitons.        Exercise Goals and Review:  Exercise Goals     Row Name 07/10/24 1144             Exercise Goals   Increase Physical Activity Yes       Intervention Provide advice, education, support and counseling about physical activity/exercise needs.;Develop an individualized exercise prescription for aerobic and resistive training based on initial evaluation findings, risk stratification, comorbidities and participant's personal goals.       Expected Outcomes Short Term: Attend rehab on a regular basis to increase amount of physical activity.;Long Term: Exercising regularly at least 3-5 days a week.;Long Term: Add in home exercise to make exercise part of routine and to increase amount of physical activity.       Increase Strength and Stamina Yes       Intervention Provide advice, education, support and counseling about physical activity/exercise needs.;Develop an individualized exercise prescription for aerobic and resistive training based on initial evaluation findings, risk stratification, comorbidities and participant's personal goals.       Expected Outcomes Short Term: Increase workloads from initial exercise prescription for resistance, speed, and METs.;Short Term: Perform resistance training exercises routinely during rehab and add in resistance training at home;Long Term: Improve cardiorespiratory fitness, muscular endurance and strength as measured by increased METs and functional capacity ( )       Able to understand and use rate of perceived exertion (RPE) scale Yes       Intervention Provide education and explanation on how to use RPE scale       Expected Outcomes Short Term: Able to use RPE daily in rehab to express subjective intensity level;Long Term:  Able to use RPE to guide intensity level when exercising independently       Knowledge and understanding of Target Heart Rate Range (THRR) Yes       Intervention Provide education  and explanation of THRR including how the numbers were predicted and where they are located for reference       Expected Outcomes Short Term: Able to state/look up THRR;Long Term: Able to use THRR to govern intensity when exercising independently;Short Term: Able to use daily as guideline for intensity in rehab       Able to check pulse independently Yes       Intervention Provide education and demonstration on how to check pulse in carotid and radial arteries.;Review the importance of being able to check your own pulse for safety during independent exercise       Expected Outcomes Short Term: Able to explain why pulse checking is important during independent exercise;Long Term: Able to check pulse independently and accurately       Understanding of Exercise Prescription Yes       Intervention Provide education, explanation, and written materials on patient's individual exercise prescription       Expected Outcomes Short Term: Able to explain program exercise prescription;Long Term: Able to explain home exercise prescription to exercise independently          Exercise Goals Re-Evaluation:  Exercise Goals Re-Evaluation     Row Name 07/16/24 1642 08/03/24 1658 08/27/24 1630 09/21/24 1659 10/03/24 1630     Exercise Goal Re-Evaluation   Exercise Goals Review Increase Physical Activity;Understanding of Exercise Prescription;Increase Strength and Stamina;Knowledge and understanding  of Target Heart Rate Range (THRR);Able to understand and use rate of perceived exertion (RPE) scale Increase Physical Activity;Understanding of Exercise Prescription;Increase Strength and Stamina;Knowledge and understanding of Target Heart Rate Range (THRR);Able to understand and use rate of perceived exertion (RPE) scale Increase Physical Activity;Understanding of Exercise Prescription;Increase Strength and Stamina;Knowledge and understanding of Target Heart Rate Range (THRR);Able to understand and use rate of perceived  exertion (RPE) scale Increase Physical Activity;Understanding of Exercise Prescription;Increase Strength and Stamina;Knowledge and understanding of Target Heart Rate Range (THRR);Able to understand and use rate of perceived exertion (RPE) scale Increase Physical Activity;Understanding of Exercise Prescription;Increase Strength and Stamina;Knowledge and understanding of Target Heart Rate Range (THRR);Able to understand and use rate of perceived exertion (RPE) scale   Comments Pt first day in the [facility] program. Pt tolerated exercise well with an average MET level of 2.3. Pt is off to a good start and is learning her THRR, RPE and ExRx Reviewed MET's, goals and home ExRx. Pt tolerated exercise well with an average MET level of 3.45. Pt is doing well and progressing MET's. She is feeling good about her goals and things at home are getting easier. She knows her limits to exercise and we reviewed exercise at home today. She will exercise 4 days on her own by walking, stationary bike, light handweights, exercise videos 30-45 mins per session Reviewed MET's. Pt tolerated exercise well with an average MET level of 4.4. She is doing very well and is progressing WL's and MET's Reviewed MET's and goals. Pt tolerated exercise well with an average MET level of 4.9. She is doing very well and is progressing WL's and MET's. Now that her lifevest is off she feels more free. She is able to do more and feel comfortable. She feels her exercise is light and would likee to increase more, but she is starting to run into her THRR. WIll reach out to Dr. Ladona about a HR increase Pt graduated the [employer]. Pt tolerated exercise well with an average MET level of 5.4. She did great in the program and increased on her post walk test by 284ft for a total of 1838ft. She will continue to exercise on her own with a variety of equipment for 4-6 days for 30-45 mins.   Expected Outcomes Will continue to monitor pt and progress  workloads as tolerated without sign or symptom Will continue to monitor pt and progress workloads as tolerated without sign or symptom Will continue to monitor pt and progress workloads as tolerated without sign or symptom Will continue to monitor pt and progress workloads as tolerated without sign or symptom Will continue to monitor pt and progress workloads as tolerated without sign or symptom      Nutrition & Weight - Outcomes:  Pre Biometrics - 07/10/24 1030       Pre Biometrics   Waist Circumference 32 inches    Hip Circumference 41.75 inches    Waist to Hip Ratio 0.77 %    Triceps Skinfold 31 mm    % Body Fat 36 %    Grip Strength 16 kg    Flexibility 14.88 in    Single Leg Stand 28.18 seconds          Post Biometrics - 09/28/24 1726        Post  Biometrics   Height 5' 5 (1.651 m)    Weight 63 kg    Waist Circumference 29 inches    Hip Circumference 38 inches  Waist to Hip Ratio 0.76 %    BMI (Calculated) 23.11    Triceps Skinfold 28 mm    % Body Fat 33.8 %    Grip Strength 30 kg    Flexibility 13.75 in    Single Leg Stand 30 seconds          Nutrition:   Nutrition Discharge:  Nutrition Assessments - 09/28/24 1728       Rate Your Plate Scores   Post Score 91          Education Questionnaire Score:  Knowledge Questionnaire Score - 09/28/24 1731       Knowledge Questionnaire Score   Post Score 24/24         Pt graduates from  Intensive/Traditional cardiac rehab program today with completion of 67 exercise and education sessions. Pt maintained good attendance and progressed nicely during their participation in rehab as evidenced by increased MET level.   Medication list reconciled. Repeat  PHQ score-0.  Pt has made significant lifestyle changes and should be commended for her success.  Maria Zamora achieved her goals during cardiac rehab.   Pt plans to continue exercise at 3-5x week at home.  Goals reviewed with patient; copy given to patient.

## 2024-10-14 NOTE — Progress Notes (Unsigned)
 Cardiology Office Note:  .   Date:  10/15/2024  ID:  Maria Zamora, DOB 16-Dec-1960, MRN [id number] PCP: Rolinda Millman, MD  [facility] Providers Cardiologist:  Gordy Bergamo, MD   History of Present Illness: .   Maria Zamora is a 63 y.o. Caucasian female patient with hypercholesterolemia not on therapy, admitted on 06/05/2024 with acute anterolateral STEMI and underwent successful angioplasty to the LAD. She had significant drop in her LVEF to 25 to 30%. She was discharged home on 06/08/2024 with LifeVest on board due to NSVT episodes while on telemetry.  This is a 74-month office visit for follow-up of ischemic cardiomyopathy.  She underwent echocardiogram on 2025 revealing normal LVEF with apical akinesis to hypokinesis.    Discussed the use of AI scribe software for clinical note transcription with the patient, who gave verbal consent to proceed.  History of Present Illness  Discussed the use of AI scribe software for clinical note transcription with the patient, who gave verbal consent to proceed.  History of Present Illness Maria Zamora is a 63 year old female with coronary artery disease and heart failure who presents for follow-up after cardiac rehabilitation. She is accompanied by her husband.  She feels great and like herself again over the past month, having recently completed cardiac rehabilitation, which she found fantastic. She has been able to travel, taking a trip to the beach for four nights to celebrate the completion of her rehab.  Dizziness occurs, particularly in the morning after taking her blood pressure medications, including metoprolol . Bending down and getting up again can exacerbate the dizziness. Her current medications include metoprolol , atorvastatin  80 mg once daily, spironolactone , and Brilinta . She was previously on Farxiga  for heart failure. She is also on aspirin  and has been advised to continue Brilinta  and aspirin  until next July.  She  has lost 20 pounds and adheres strictly to her diet. No swelling in her legs and no angina since her coronary artery disease diagnosis. Her LDL cholesterol is well controlled at 50.   Cardiac Studies relevent.    ECHOCARDIOGRAM COMPLETE 08/29/2024  1. Left ventricular ejection fraction, by estimation, is 50 to 55%. The left ventricle has low normal function. The left ventricle demonstrates regional wall motion abnormalities. The apical anterior segment is hypokinetic. Left ventricular diastolic function could not be evaluated. 2.  Compared to 06/06/2024, EF has improved from 30%.  Otherwise there is no other significant abnormalities.  Cardiac Catheterization 06/05/24:    Proximal LAD  3.5 x 16 mm Synergy XD DES. Large D1 90% sidebranch occlusion has been balloon angioplasty with 2.0 x 12 mm emerge at 3 atmospheric pressure.      Labs   Lab Results  Component Value Date   CHOL 121 07/03/2024   HDL 44 07/03/2024   LDLCALC 50 07/03/2024   TRIG 159 (H) 07/03/2024   CHOLHDL 2.8 07/03/2024   Lipoprotein (a)  Date/Time Value Ref Range Status  06/05/2024 11:41 PM 11.4 <75.0 nmol/L Final    Comment:    (NOTE) This test was developed and its performance characteristics determined by [facility]. It has not been cleared or approved by the Food and Drug Administration. Note:  Values greater than or equal to 75.0 nmol/L may       indicate an independent Zamora factor for CHD,       but must be evaluated with caution when applied       to non-Caucasian populations due to the  influence of genetic factors on Lp(a) across       ethnicities. Performed At: [facility] [address] [place], [place] [id number] Jennette Shorter MD Ey:[id number]     Recent Labs    06/17/24 1603 06/17/24 1630 07/03/24 0828 07/06/24 1101 08/29/24 1028  NA 136   < > 139 139 135  K 4.1   < > 4.8 4.8 4.9  CL 102   < > 100 104 100  CO2 24  --  19* 26 25  GLUCOSE 113*   < > 98 101* 109*  BUN 18   <  > 20 17 16   CREATININE 0.80   < > 0.79 0.79 0.85  CALCIUM  9.3  --  9.9 9.9 9.6  GFRNONAA >60  --   --  >60 >60   < > = values in this interval not displayed.    Lab Results  Component Value Date   ALT 21 06/13/2015   AST 23 06/13/2015   ALKPHOS 92 06/13/2015   BILITOT 0.6 06/13/2015      Latest Ref Rng & Units 06/17/2024    4:30 PM 06/17/2024    4:03 PM 06/07/2024    8:24 AM  CBC  WBC 4.0 - 10.5 K/uL  11.5  15.3   Hemoglobin 12.0 - 15.0 g/dL 84.9  85.4  85.7   Hematocrit 36.0 - 46.0 % 44.0  45.0  43.1   Platelets 150 - 400 K/uL  344  267    Lab Results  Component Value Date   HGBA1C 5.2 06/05/2024    Lab Results  Component Value Date   TSH 1.50 06/16/2016     ROS  Review of Systems  Cardiovascular:  Negative for chest pain, dyspnea on exertion, leg swelling, palpitations and syncope.  Neurological:  Positive for dizziness.   Physical Exam:   VS:  BP 99/61 (BP Location: Left Arm, Patient Position: Sitting, Cuff Size: Normal)   Pulse (!) 58   Resp 16   Ht 5' 5 (1.651 m)   Wt 133 lb (60.3 kg)   LMP 11/18/2015 (Exact Date)   SpO2 99%   BMI 22.13 kg/m    Wt Readings from Last 3 Encounters:  10/15/24 133 lb (60.3 kg)  09/28/24 138 lb 14.2 oz (63 kg)  08/29/24 140 lb (63.5 kg)    BP Readings from Last 3 Encounters:  10/15/24 99/61  08/29/24 106/64  07/10/24 102/64   Physical Exam Neck:     Vascular: No carotid bruit or JVD.  Cardiovascular:     Rate and Rhythm: Normal rate and regular rhythm.     Pulses: Intact distal pulses.     Heart sounds: Normal heart sounds. No murmur heard.    No gallop.  Pulmonary:     Effort: Pulmonary effort is normal.     Breath sounds: Normal breath sounds.  Abdominal:     General: Bowel sounds are normal.     Palpations: Abdomen is soft.  Musculoskeletal:     Right lower leg: No edema.     Left lower leg: No edema.    EKG:         ASSESSMENT AND PLAN: .      ICD-10-CM   1. Coronary artery disease involving native  coronary artery of native heart without angina pectoris  I25.10     2. 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 Pure hypercholesterolemia  E78.00  Assessment & Plan Atherosclerotic heart disease of native coronary artery without angina pectoris Coronary artery disease is well-managed with no angina. LAD Stents placed in July 2025. On dual antiplatelet therapy with Brilinta  and aspirin . LDL cholesterol is well-controlled at 50 mg/dL with atorvastatin  80 mg daily. No need for antibiotics prior to dental procedures. Low Zamora of recurrence due to effective management with medications and lifestyle changes. - Continue Brilinta  and aspirin  until July 2026, then will switch to Plavix. - Continue atorvastatin  80 mg daily. - No antibiotics needed prior to dental procedures. - Allow use of over-the-counter decongestants for short-term use if needed.  Recovered systolic heart failure with residual apical akinesis Heart function has normalized with an ejection fraction of 55-60%. Residual apical akinesis is present but not clinically significant. No symptoms of heart failure. Medications adjusted to reduce dizziness and manage blood pressure. No need for further echocardiograms. - Stopped Farxiga . - Reduced spironolactone  dose by half. - Monitor for dizziness; if present, will consider reducing metoprolol  dose. - No further echocardiograms needed. - Will perform EKG at next visit in July 2026.  Pure hypercholesterolemia Cholesterol levels are well-controlled with atorvastatin  80 mg daily. LDL is at 50 mg/dL. - Continue atorvastatin  80 mg daily.   Follow up: 6 months and will consider stopping DAPT and switching her to Plavix. I am very pleased with her progress. Husband present.   Signed,  Gordy Bergamo, MD, [facility] 10/15/2024, 9:37 AM [facility] [address] [place], [place] [address] Phone:  [phone]. Fax:  [phone]

## 2024-10-15 ENCOUNTER — Encounter: Payer: Self-pay | Admitting: Cardiology

## 2024-10-15 ENCOUNTER — Ambulatory Visit: Attending: Cardiology | Admitting: Cardiology

## 2024-10-15 VITALS — BP 99/61 | HR 58 | Resp 16 | Ht 65.0 in | Wt 133.0 lb

## 2024-10-15 DIAGNOSIS — I5020 Unspecified systolic (congestive) heart failure: Secondary | ICD-10-CM | POA: Diagnosis not present

## 2024-10-15 DIAGNOSIS — I2510 Atherosclerotic heart disease of native coronary artery without angina pectoris: Secondary | ICD-10-CM

## 2024-10-15 DIAGNOSIS — E7800 Pure hypercholesterolemia, unspecified: Secondary | ICD-10-CM | POA: Diagnosis not present

## 2024-10-15 MED ORDER — SPIRONOLACTONE 25 MG PO TABS: 12.5000 mg | ORAL_TABLET | Freq: Every day | ORAL | Status: AC

## 2024-10-15 NOTE — Patient Instructions (Signed)
 Medication Instructions:  Decrease Spironolactone  12.5 mg daily   *If you need a refill on your cardiac medications before your next appointment, please call your pharmacy*  Lab Work: NONE ordered at this time of appointment   Testing/Procedures: NONE ordered at this time of appointment   Follow-Up: At [facility], you and your health needs are our priority.  As part of our continuing mission to provide you with exceptional heart care, our providers are all part of one team.  This team includes your primary Cardiologist (physician) and Advanced Practice Providers or APPs (Physician Assistants and Nurse Practitioners) who all work together to provide you with the care you need, when you need it.  Your next appointment:   6 month(s)  Provider:   Gordy Bergamo, MD    We recommend signing up for the patient portal called MyChart.  Sign up information is provided on this After Visit Summary.  MyChart is used to connect with patients for Virtual Visits (Telemedicine).  Patients are able to view lab/test results, encounter notes, upcoming appointments, etc.  Non-urgent messages can be sent to your provider as well.   To learn more about what you can do with MyChart, go to [email or web address].

## 2025-03-21 ENCOUNTER — Ambulatory Visit: Admitting: Obstetrics and Gynecology
